# Patient Record
Sex: Male | Born: 1950 | Hispanic: Yes | Marital: Married | Smoking: Former smoker
Health system: Southern US, Community
[De-identification: ages and names within clinical notes are randomized; demographics above are authoritative.]

## PROBLEM LIST (undated history)

## (undated) DIAGNOSIS — F1010 Alcohol abuse, uncomplicated: Secondary | ICD-10-CM

## (undated) DIAGNOSIS — K7030 Alcoholic cirrhosis of liver without ascites: Secondary | ICD-10-CM

## (undated) DIAGNOSIS — D649 Anemia, unspecified: Secondary | ICD-10-CM

## (undated) DIAGNOSIS — I10 Essential (primary) hypertension: Secondary | ICD-10-CM

## (undated) DIAGNOSIS — I8500 Esophageal varices without bleeding: Secondary | ICD-10-CM

## (undated) HISTORY — PX: PARACENTESIS: SHX844

## (undated) SURGERY — Surgical Case
Anesthesia: *Unknown

---

## 2011-07-14 ENCOUNTER — Ambulatory Visit (HOSPITAL_COMMUNITY)
Admission: RE | Admit: 2011-07-14 | Discharge: 2011-07-14 | Disposition: A | Discharge status: Home / Self Care | Payer: Medicare Other | Source: Ambulatory Visit | Attending: Gastroenterology | Admitting: Gastroenterology

## 2011-07-14 DIAGNOSIS — K7460 Unspecified cirrhosis of liver: Secondary | ICD-10-CM | POA: Insufficient documentation

## 2011-07-14 DIAGNOSIS — K319 Disease of stomach and duodenum, unspecified: Secondary | ICD-10-CM | POA: Insufficient documentation

## 2011-07-14 DIAGNOSIS — I8510 Secondary esophageal varices without bleeding: Secondary | ICD-10-CM | POA: Insufficient documentation

## 2011-07-14 DIAGNOSIS — K766 Portal hypertension: Secondary | ICD-10-CM | POA: Insufficient documentation

## 2011-07-14 DIAGNOSIS — Z79899 Other long term (current) drug therapy: Secondary | ICD-10-CM | POA: Insufficient documentation

## 2011-07-14 LAB — BASIC METABOLIC PANEL
CO2: 28 mEq/L (ref 19–32)
Chloride: 101 mEq/L (ref 96–112)
Glucose, Bld: 81 mg/dL (ref 70–99)
Sodium: 138 mEq/L (ref 135–145)

## 2011-08-16 ENCOUNTER — Ambulatory Visit (INDEPENDENT_AMBULATORY_CARE_PROVIDER_SITE_OTHER): Payer: Medicare Other | Admitting: Gastroenterology

## 2011-08-16 DIAGNOSIS — R945 Abnormal results of liver function studies: Secondary | ICD-10-CM

## 2011-08-16 DIAGNOSIS — K7460 Unspecified cirrhosis of liver: Secondary | ICD-10-CM

## 2011-08-16 DIAGNOSIS — K766 Portal hypertension: Secondary | ICD-10-CM

## 2016-04-02 DIAGNOSIS — K652 Spontaneous bacterial peritonitis: Secondary | ICD-10-CM

## 2016-04-02 DIAGNOSIS — R188 Other ascites: Secondary | ICD-10-CM | POA: Diagnosis not present

## 2016-04-02 DIAGNOSIS — E871 Hypo-osmolality and hyponatremia: Secondary | ICD-10-CM

## 2016-04-02 DIAGNOSIS — I81 Portal vein thrombosis: Secondary | ICD-10-CM | POA: Diagnosis not present

## 2016-04-03 DIAGNOSIS — K7030 Alcoholic cirrhosis of liver without ascites: Secondary | ICD-10-CM

## 2016-04-03 DIAGNOSIS — I10 Essential (primary) hypertension: Secondary | ICD-10-CM

## 2016-04-03 DIAGNOSIS — I81 Portal vein thrombosis: Secondary | ICD-10-CM

## 2016-04-03 DIAGNOSIS — D638 Anemia in other chronic diseases classified elsewhere: Secondary | ICD-10-CM

## 2016-04-03 DIAGNOSIS — K767 Hepatorenal syndrome: Secondary | ICD-10-CM

## 2016-04-03 DIAGNOSIS — E871 Hypo-osmolality and hyponatremia: Secondary | ICD-10-CM

## 2016-04-03 DIAGNOSIS — K652 Spontaneous bacterial peritonitis: Secondary | ICD-10-CM

## 2016-05-24 ENCOUNTER — Other Ambulatory Visit: Payer: Self-pay | Admitting: Unknown Physician Specialty

## 2016-05-24 DIAGNOSIS — R188 Other ascites: Secondary | ICD-10-CM

## 2016-05-27 ENCOUNTER — Ambulatory Visit
Admission: RE | Admit: 2016-05-27 | Discharge: 2016-05-27 | Disposition: A | Discharge status: Home / Self Care | Payer: Medicare Other | Source: Ambulatory Visit | Attending: Unknown Physician Specialty | Admitting: Unknown Physician Specialty

## 2016-05-27 DIAGNOSIS — R188 Other ascites: Secondary | ICD-10-CM

## 2016-05-27 HISTORY — PX: IR GENERIC HISTORICAL: IMG1180011

## 2016-05-27 NOTE — H&P (Signed)
Chief Complaint: Patient was seen in consultation today for  Chief Complaint  Patient presents with  . Advice Only    Consult for Denver Shunt     at the request of Butler,Robert  Referring Physician(s): Butler,Robert  Supervising Physician: Gilmer Mor  History of Present Illness: Jaime Russell is a 65 y.o. male with cirrhosis from history of alcohol abuse.   He's been re-referred to [place] for consideration for [place] Shunt to manage ascites which has been worseing over the last 2-3 months.   He has large volume ascites and requires paracentesis about every other week.   He takes furosemide and spironolactone. He also takes lactulose to help minimize encephalopathy.  He is following a low sodium diet.   According to his chart, he has been off alcohol for 15 years.   He lives with his daughter and son-in-law.  He is here with several family members including his niece who speaks [place] well.   His calculated MELD score is 20 and is Child Pugh class B  He also has a known portal vein thrombus and therefore is NOT a candidate for TIPS.   No past medical history on file.  No past surgical history on file.  Allergies: Review of patient's allergies indicates no known allergies.  Medications: Prior to Admission medications   Medication Sig Start Date End Date Taking? Authorizing Provider  esomeprazole (NEXIUM) 40 MG capsule Take 40 mg by mouth daily at 12 noon.   Yes Historical Provider, MD  Ferrous Sulfate Dried 200 (65 Fe) MG TABS Take by mouth.   Yes Historical Provider, MD  furosemide (LASIX) 40 MG tablet Take 40 mg by mouth.   Yes Historical Provider, MD  lactulose (CHRONULAC) 10 GM/15ML solution Take by mouth 2 (two) times daily.   Yes Historical Provider, MD  propranolol (INDERAL) 10 MG tablet Take 10 mg by mouth 3 (three) times daily.   Yes Historical Provider, MD  rifaximin (XIFAXAN) 550 MG TABS tablet Take 550 mg by mouth 2 (two) times daily.   Yes  Historical Provider, MD     No family history on file.  Social History   Social History  . Marital status: Married    Spouse name: N/A  . Number of children: N/A  . Years of education: N/A   Social History Main Topics  . Smoking status: Not on file  . Smokeless tobacco: Not on file  . Alcohol use Not on file  . Drug use: Unknown  . Sexual activity: Not on file   Other Topics Concern  . Not on file   Social History Narrative  . No narrative on file    Review of Systems  Vital Signs: BP 115/61 (BP Location: Left Arm, Patient Position: Sitting, Cuff Size: Normal)   Pulse 72   Temp 98 F (36.7 C) (Oral)   Physical Exam  Constitutional: He is oriented to person, place, and time.  Thin, ill appearing, NAD  HENT:  Head: Normocephalic and atraumatic.  Eyes: EOM are normal. Scleral icterus is present.  Neck: Normal range of motion.  Abdominal: He exhibits distension.  Musculoskeletal:  In a wheelchair  Neurological: He is alert and oriented to person, place, and time. No cranial nerve deficit.  Skin:  Mild jaundice  Psychiatric: He has a normal mood and affect. His behavior is normal. Judgment and thought content normal.  Vitals reviewed.     Imaging: No results found.  Labs:  CBC: No results for input(s): WBC,  HGB, HCT, PLT in the last 8760 hours.  COAGS: No results for input(s): INR, APTT in the last 8760 hours.  BMP: No results for input(s): NA, K, CL, CO2, GLUCOSE, BUN, CALCIUM, CREATININE, GFRNONAA, GFRAA in the last 8760 hours.  Invalid input(s): CMP  LIVER FUNCTION TESTS: No results for input(s): BILITOT, AST, ALT, ALKPHOS, PROT, ALBUMIN in the last 8760 hours.  TUMOR MARKERS: No results for input(s): AFPTM, CEA, CA199, CHROMGRNA in the last 8760 hours.  Assessment:  Large volume ascites secondary to cirrhosis due to history of alcohol abuse which is requiring paracentesis every other week.  His Meld score is 20 and his Child Pugh class is  B.  Not a TIPS candidate secondary to portal vein thrombus.  Appears to be a good candidate for Denver Shunt  Will obtain cytology during next paracentesis to rule out infection.  Will obtain ECHO to evaluate cardiac function.  If ECHO is normal and peritoneal fluid shows no infection, will proceed with placement of [place] Shunt.  Dr. Loreta Ave did explain to the patient and family the risks and benefits of the [facility] shunt including bleeding, infection, heart failure, sepsis, and death. He also explained how and why they will need to pump the shunt twice per day.  All of the patient's questions were answered to the best of our ability.  The patient is agreeable to proceed in the near future.  Electronically Signed: Gwynneth Macleod PA-C 05/27/2016, 1:45 PM   Please refer to Dr. Kenna Gilbert attestation of this note for management and plan.

## 2016-05-28 ENCOUNTER — Telehealth: Payer: Self-pay | Admitting: *Deleted

## 2016-05-28 ENCOUNTER — Encounter (HOSPITAL_COMMUNITY): Payer: Self-pay | Admitting: Emergency Medicine

## 2016-05-28 ENCOUNTER — Inpatient Hospital Stay (HOSPITAL_COMMUNITY)
Admission: EM | Admit: 2016-05-28 | Discharge: 2016-06-01 | DRG: 441 | Disposition: A | Discharge status: Home Health | Payer: Medicare Other | Attending: Student in an Organized Health Care Education/Training Program | Admitting: Student in an Organized Health Care Education/Training Program

## 2016-05-28 ENCOUNTER — Emergency Department (HOSPITAL_COMMUNITY): Payer: Medicare Other

## 2016-05-28 DIAGNOSIS — K7031 Alcoholic cirrhosis of liver with ascites: Secondary | ICD-10-CM | POA: Diagnosis present

## 2016-05-28 DIAGNOSIS — D500 Iron deficiency anemia secondary to blood loss (chronic): Secondary | ICD-10-CM | POA: Diagnosis present

## 2016-05-28 DIAGNOSIS — I8500 Esophageal varices without bleeding: Secondary | ICD-10-CM

## 2016-05-28 DIAGNOSIS — K7290 Hepatic failure, unspecified without coma: Principal | ICD-10-CM

## 2016-05-28 DIAGNOSIS — K31819 Angiodysplasia of stomach and duodenum without bleeding: Secondary | ICD-10-CM | POA: Diagnosis present

## 2016-05-28 DIAGNOSIS — I81 Portal vein thrombosis: Secondary | ICD-10-CM | POA: Diagnosis present

## 2016-05-28 DIAGNOSIS — K7040 Alcoholic hepatic failure without coma: Secondary | ICD-10-CM | POA: Diagnosis present

## 2016-05-28 DIAGNOSIS — G9340 Encephalopathy, unspecified: Secondary | ICD-10-CM

## 2016-05-28 DIAGNOSIS — I8511 Secondary esophageal varices with bleeding: Secondary | ICD-10-CM | POA: Diagnosis present

## 2016-05-28 DIAGNOSIS — D735 Infarction of spleen: Secondary | ICD-10-CM | POA: Diagnosis present

## 2016-05-28 DIAGNOSIS — E1122 Type 2 diabetes mellitus with diabetic chronic kidney disease: Secondary | ICD-10-CM | POA: Diagnosis present

## 2016-05-28 DIAGNOSIS — R188 Other ascites: Secondary | ICD-10-CM

## 2016-05-28 DIAGNOSIS — I8510 Secondary esophageal varices without bleeding: Secondary | ICD-10-CM | POA: Diagnosis not present

## 2016-05-28 DIAGNOSIS — K317 Polyp of stomach and duodenum: Secondary | ICD-10-CM | POA: Diagnosis present

## 2016-05-28 DIAGNOSIS — E871 Hypo-osmolality and hyponatremia: Secondary | ICD-10-CM | POA: Diagnosis present

## 2016-05-28 DIAGNOSIS — N189 Chronic kidney disease, unspecified: Secondary | ICD-10-CM | POA: Diagnosis present

## 2016-05-28 DIAGNOSIS — K7682 Hepatic encephalopathy: Secondary | ICD-10-CM | POA: Diagnosis present

## 2016-05-28 DIAGNOSIS — N62 Hypertrophy of breast: Secondary | ICD-10-CM | POA: Diagnosis present

## 2016-05-28 DIAGNOSIS — K766 Portal hypertension: Secondary | ICD-10-CM | POA: Diagnosis present

## 2016-05-28 DIAGNOSIS — Z79899 Other long term (current) drug therapy: Secondary | ICD-10-CM | POA: Diagnosis not present

## 2016-05-28 DIAGNOSIS — Z01810 Encounter for preprocedural cardiovascular examination: Secondary | ICD-10-CM | POA: Diagnosis not present

## 2016-05-28 DIAGNOSIS — N179 Acute kidney failure, unspecified: Secondary | ICD-10-CM | POA: Diagnosis present

## 2016-05-28 DIAGNOSIS — K5660 Unspecified intestinal obstruction: Secondary | ICD-10-CM | POA: Diagnosis present

## 2016-05-28 DIAGNOSIS — K767 Hepatorenal syndrome: Secondary | ICD-10-CM | POA: Diagnosis present

## 2016-05-28 DIAGNOSIS — Z9889 Other specified postprocedural states: Secondary | ICD-10-CM | POA: Diagnosis not present

## 2016-05-28 DIAGNOSIS — K429 Umbilical hernia without obstruction or gangrene: Secondary | ICD-10-CM | POA: Diagnosis present

## 2016-05-28 DIAGNOSIS — R161 Splenomegaly, not elsewhere classified: Secondary | ICD-10-CM | POA: Diagnosis present

## 2016-05-28 HISTORY — DX: Esophageal varices without bleeding: I85.00

## 2016-05-28 HISTORY — DX: Alcoholic cirrhosis of liver without ascites: K70.30

## 2016-05-28 HISTORY — DX: Alcohol abuse, uncomplicated: F10.10

## 2016-05-28 LAB — I-STAT ARTERIAL BLOOD GAS, ED
Acid-base deficit: 4 mmol/L — ABNORMAL HIGH (ref 0.0–2.0)
Bicarbonate: 18.4 meq/L — ABNORMAL LOW (ref 20.0–24.0)
O2 Saturation: 99 %
Patient temperature: 96.8
TCO2: 19 mmol/L (ref 0–100)
pCO2 arterial: 24.2 mmHg — ABNORMAL LOW (ref 35.0–45.0)
pH, Arterial: 7.487 — ABNORMAL HIGH (ref 7.350–7.450)
pO2, Arterial: 101 mmHg — ABNORMAL HIGH (ref 80.0–100.0)

## 2016-05-28 LAB — CBC WITH DIFFERENTIAL/PLATELET
BASOS ABS: 0.1 10*3/uL (ref 0.0–0.1)
Basophils Relative: 1 %
Eosinophils Absolute: 0.1 10*3/uL (ref 0.0–0.7)
Eosinophils Relative: 1 %
HEMATOCRIT: 34.7 % — AB (ref 39.0–52.0)
HEMOGLOBIN: 11.4 g/dL — AB (ref 13.0–17.0)
LYMPHS ABS: 0.9 10*3/uL (ref 0.7–4.0)
LYMPHS PCT: 11 %
MCH: 26 pg (ref 26.0–34.0)
MCHC: 32.9 g/dL (ref 30.0–36.0)
MCV: 79.2 fL (ref 78.0–100.0)
Monocytes Absolute: 0.9 10*3/uL (ref 0.1–1.0)
Monocytes Relative: 11 %
NEUTROS ABS: 6.3 10*3/uL (ref 1.7–7.7)
NEUTROS PCT: 76 %
PLATELETS: 319 10*3/uL (ref 150–400)
RBC: 4.38 MIL/uL (ref 4.22–5.81)
RDW: 16.5 % — ABNORMAL HIGH (ref 11.5–15.5)
WBC: 8.3 10*3/uL (ref 4.0–10.5)

## 2016-05-28 LAB — ETHANOL

## 2016-05-28 LAB — COMPREHENSIVE METABOLIC PANEL
ALBUMIN: 2.8 g/dL — AB (ref 3.5–5.0)
ALT: 35 U/L (ref 17–63)
AST: 54 U/L — AB (ref 15–41)
Alkaline Phosphatase: 369 U/L — ABNORMAL HIGH (ref 38–126)
Anion gap: 12 (ref 5–15)
BILIRUBIN TOTAL: 1.4 mg/dL — AB (ref 0.3–1.2)
BUN: 43 mg/dL — AB (ref 6–20)
CO2: 17 mmol/L — ABNORMAL LOW (ref 22–32)
CREATININE: 1.42 mg/dL — AB (ref 0.61–1.24)
Calcium: 8.8 mg/dL — ABNORMAL LOW (ref 8.9–10.3)
Chloride: 103 mmol/L (ref 101–111)
GFR calc Af Amer: 59 mL/min — ABNORMAL LOW (ref >60)
GFR, EST NON AFRICAN AMERICAN: 51 mL/min — AB (ref >60)
GLUCOSE: 136 mg/dL — AB (ref 65–99)
POTASSIUM: 4.4 mmol/L (ref 3.5–5.1)
Sodium: 132 mmol/L — ABNORMAL LOW (ref 135–145)
TOTAL PROTEIN: 7.5 g/dL (ref 6.5–8.1)

## 2016-05-28 LAB — RAPID URINE DRUG SCREEN, HOSP PERFORMED
AMPHETAMINES: NOT DETECTED
BARBITURATES: NOT DETECTED
BENZODIAZEPINES: NOT DETECTED
Cocaine: NOT DETECTED
Opiates: NOT DETECTED
Tetrahydrocannabinol: NOT DETECTED

## 2016-05-28 LAB — TYPE AND SCREEN
ABO/RH(D): O POS
ANTIBODY SCREEN: NEGATIVE

## 2016-05-28 LAB — PROTIME-INR
INR: 1.22
PROTHROMBIN TIME: 15.5 s — AB (ref 11.4–15.2)

## 2016-05-28 LAB — URINALYSIS, ROUTINE W REFLEX MICROSCOPIC
Bilirubin Urine: NEGATIVE
GLUCOSE, UA: NEGATIVE mg/dL
HGB URINE DIPSTICK: NEGATIVE
Ketones, ur: NEGATIVE mg/dL
LEUKOCYTES UA: NEGATIVE
Nitrite: NEGATIVE
PH: 6 (ref 5.0–8.0)
Protein, ur: NEGATIVE mg/dL
Specific Gravity, Urine: 1.017 (ref 1.005–1.030)

## 2016-05-28 LAB — CBC
HCT: 32.3 % — ABNORMAL LOW (ref 39.0–52.0)
HEMATOCRIT: 32.3 % — AB (ref 39.0–52.0)
Hemoglobin: 10.6 g/dL — ABNORMAL LOW (ref 13.0–17.0)
Hemoglobin: 10.5 g/dL — ABNORMAL LOW (ref 13.0–17.0)
MCH: 26.1 pg (ref 26.0–34.0)
MCH: 25.9 pg — ABNORMAL LOW (ref 26.0–34.0)
MCHC: 32.8 g/dL (ref 30.0–36.0)
MCHC: 32.5 g/dL (ref 30.0–36.0)
MCV: 79.6 fL (ref 78.0–100.0)
MCV: 79.8 fL (ref 78.0–100.0)
PLATELETS: 291 10*3/uL (ref 150–400)
PLATELETS: 252 10*3/uL (ref 150–400)
RBC: 4.06 MIL/uL — AB (ref 4.22–5.81)
RBC: 4.05 MIL/uL — ABNORMAL LOW (ref 4.22–5.81)
RDW: 16.5 % — AB (ref 11.5–15.5)
RDW: 16.6 % — AB (ref 11.5–15.5)
WBC: 8.6 10*3/uL (ref 4.0–10.5)
WBC: 11.7 10*3/uL — AB (ref 4.0–10.5)

## 2016-05-28 LAB — ABO/RH: ABO/RH(D): O POS

## 2016-05-28 LAB — POC OCCULT BLOOD, ED: Fecal Occult Bld: NEGATIVE

## 2016-05-28 LAB — CBG MONITORING, ED: Glucose-Capillary: 103 mg/dL — ABNORMAL HIGH (ref 65–99)

## 2016-05-28 LAB — I-STAT CG4 LACTIC ACID, ED: Lactic Acid, Venous: 3.35 mmol/L (ref 0.5–1.9)

## 2016-05-28 LAB — LACTIC ACID, PLASMA: LACTIC ACID, VENOUS: 2.2 mmol/L — AB (ref 0.5–1.9)

## 2016-05-28 LAB — AMMONIA: Ammonia: 210 umol/L — ABNORMAL HIGH (ref 9–35)

## 2016-05-28 MED ORDER — SODIUM CHLORIDE 0.9 % IV BOLUS (SEPSIS)
500.0000 mL | Freq: Once | INTRAVENOUS | Status: AC
  Administered 2016-05-28: 500 mL via INTRAVENOUS

## 2016-05-28 MED ORDER — LACTULOSE ENEMA
300.0000 mL | Freq: Two times a day (BID) | ORAL | Status: DC
  Administered 2016-05-28: 300 mL via RECTAL
  Filled 2016-05-28 (×4): qty 300

## 2016-05-28 MED ORDER — ACETAMINOPHEN 650 MG RE SUPP: 650.0000 mg | Freq: Four times a day (QID) | RECTAL | Status: DC | PRN

## 2016-05-28 MED ORDER — ACETAMINOPHEN 325 MG PO TABS
650.0000 mg | ORAL_TABLET | Freq: Four times a day (QID) | ORAL | Status: DC | PRN
  Administered 2016-05-31: 650 mg via ORAL
  Filled 2016-05-28: qty 2

## 2016-05-28 MED ORDER — OCTREOTIDE LOAD VIA INFUSION
50.0000 ug | Freq: Once | INTRAVENOUS | Status: AC
  Administered 2016-05-28: 50 ug via INTRAVENOUS
  Filled 2016-05-28: qty 25

## 2016-05-28 MED ORDER — PANTOPRAZOLE SODIUM 40 MG IV SOLR
40.0000 mg | Freq: Two times a day (BID) | INTRAVENOUS | Status: DC
  Administered 2016-05-28 – 2016-05-30 (×5): 40 mg via INTRAVENOUS
  Filled 2016-05-28 (×6): qty 40

## 2016-05-28 MED ORDER — PANTOPRAZOLE SODIUM 40 MG IV SOLR
40.0000 mg | Freq: Once | INTRAVENOUS | Status: AC
  Administered 2016-05-28: 40 mg via INTRAVENOUS
  Filled 2016-05-28: qty 40

## 2016-05-28 MED ORDER — DEXTROSE 5 % IV SOLN
1.0000 g | INTRAVENOUS | Status: DC
  Administered 2016-05-28 – 2016-05-31 (×4): 1 g via INTRAVENOUS
  Filled 2016-05-28 (×5): qty 10

## 2016-05-28 MED ORDER — ONDANSETRON HCL 4 MG/2ML IJ SOLN
4.0000 mg | Freq: Once | INTRAMUSCULAR | Status: AC
  Administered 2016-05-28: 4 mg via INTRAVENOUS
  Filled 2016-05-28: qty 2

## 2016-05-28 MED ORDER — SODIUM CHLORIDE 0.9 % IV SOLN
50.0000 ug/h | INTRAVENOUS | Status: DC
  Administered 2016-05-28 – 2016-05-30 (×5): 50 ug/h via INTRAVENOUS
  Filled 2016-05-28 (×9): qty 1

## 2016-05-28 MED ORDER — LACTULOSE ENEMA
300.0000 mL | Freq: Once | ORAL | Status: AC
  Administered 2016-05-28: 300 mL via RECTAL
  Filled 2016-05-28: qty 300

## 2016-05-28 NOTE — Telephone Encounter (Signed)
Patient Jaime Russell's niece Merdis Delay called and states pt is confused, throwing up blood and shaking. She has spoken with his physician at [facility], Dr Charm Barges and he has advised them to go to [facility] ED. I have sent a msg to Dr Loreta Ave to make him aware.

## 2016-05-28 NOTE — ED Notes (Signed)
Attempted report 

## 2016-05-28 NOTE — ED Notes (Signed)
Pt rectal temp. 96.8 Nurse was notified.

## 2016-05-28 NOTE — ED Notes (Signed)
EDP aware of Temp. Warm blankets given.

## 2016-05-28 NOTE — ED Notes (Signed)
Jewelry given to family (2 necklaces)

## 2016-05-28 NOTE — ED Triage Notes (Signed)
Patient diaphoric on arrival. Patient confused. Assessment done with stratus. Patient unable to answer any questions with interpreter.

## 2016-05-28 NOTE — ED Triage Notes (Signed)
Patient comes from home with complaints of Alteral Mental Status. Per EMS family found patient on the floor confused and has become increasingly altered. EMS states patient responds to verbal stimuli.

## 2016-05-28 NOTE — H&P (Signed)
Date: 05/28/2016               Patient Name:  Jaime Russell MRN: [id number]  DOB: 01/06/1951 Age / Sex: 65 y.o., male   PCP: Maryella Shivers, MD         Medical Service: Internal Medicine Teaching Service         Attending Physician: Dr. Axel Filler, MD    First Contact: Dr. Holley Raring Pager: [phone]  Second Contact: Dr. Charlott Rakes Pager: [phone]       After Hours (After 5p/  First Contact Pager: [phone]  weekends / holidays): Second Contact Pager: [phone]   Chief Complaint: hepatic encephalopathy  History of Present Illness: [name] is a 65 y.o. 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 Pt is minimally responsive and family is not present at the time of the exam. History is gathered from chart review.  He was reportedly found down by family this morning with AMS. He had reportedly been having N/V for 1 week prior. Yesterday, he was evaluated by Ir for a [place] shunt to relieve his refractory ascites. At that time, records reflects the pt had normal MS.  Since presentation to the ED, pt has been lethargic but rousable and has had several episodes of emesis w/ small flecks of blood. FOBT was negative and lactulose enema was started. GI was consulted and have seen the patient. He was found to be hypothermic, but otherwise HDS w/o fever, tachycardia, WBC. Head CT was ordered to r/o ICH in the setting of unwitnessed fall vs collapse.  Meds: Current Facility-Administered Medications  Medication Dose Route Frequency Provider Last Rate Last Dose  . acetaminophen (TYLENOL) tablet 650 mg  650 mg Oral Q6H PRN Norman Herrlich, MD       Or  . acetaminophen (TYLENOL) suppository 650 mg  650 mg Rectal Q6H PRN Norman Herrlich, MD      . cefTRIAXone (ROCEPHIN) 1 g in dextrose 5 % 50 mL IVPB  1 g Intravenous Q24H Vena Rua, PA-C 100 mL/hr at 05/28/16 1606 1 g at 05/28/16 1606  . lactulose  (CHRONULAC) enema 200 gm  300 mL Rectal BID Norman Herrlich, MD      . octreotide (SANDOSTATIN) 500 mcg in sodium chloride 0.9 % 250 mL (2 mcg/mL) infusion  50 mcg/hr Intravenous Continuous Sherwood Gambler, MD 25 mL/hr at 05/28/16 1327 50 mcg/hr at 05/28/16 1327  . pantoprazole (PROTONIX) injection 40 mg  40 mg Intravenous Q12H Vena Rua, PA-C   40 mg at 05/28/16 1601   Current Outpatient Prescriptions  Medication Sig Dispense Refill  . esomeprazole (NEXIUM) 40 MG capsule Take 40 mg by mouth daily at 12 noon.    . ferrous sulfate 325 (65 FE) MG tablet Take 325 mg by mouth daily with breakfast.    . furosemide (LASIX) 40 MG tablet Take 40 mg by mouth 2 (two) times daily.     Marland Kitchen lactulose (CHRONULAC) 10 GM/15ML solution Take 30 g by mouth 2 (two) times daily.     . propranolol (INDERAL) 10 MG tablet Take 10 mg by mouth 2 (two) times daily.     . rifaximin (XIFAXAN) 550 MG TABS tablet Take 550 mg by mouth 2 (two) times daily.     Allergies: Allergies as of 05/28/2016  . (No Known Allergies)   Past Medical History:  Diagnosis Date  . Alcohol  abuse   . Alcoholic cirrhosis ([place])   . Esophageal varices (HCC)    Family History: Unable to be obtained d/t pt's AMS  Social History: Unable to be obtained d/t pt's AMS  Review of Systems: A complete ROS was negative except as per HPI. Review of Systems  Unable to perform ROS: Mental status change    Physical Exam: Vitals:   05/28/16 1530 05/28/16 1545 05/28/16 1600 05/28/16 1608  BP: 146/93 133/66 144/83   Pulse: 100 96 101   Resp: _0 Temp:    98.2 F (36.8 C)  TempSrc:    Oral  SpO2: 100% 100% 100%    Physical Exam  Constitutional: He appears lethargic.  Chronically ill-appearing  HENT:  Head: Normocephalic and atraumatic.  Eyes: Pupils are equal, round, and reactive to light.  Cardiovascular: Normal rate, regular rhythm, normal heart sounds and intact distal pulses.  Exam reveals no gallop and no friction rub.   No  murmur heard. Pulmonary/Chest: Effort normal and breath sounds normal. No respiratory distress. He has no wheezes. He has no rales.  Abdominal: He exhibits distension, fluid wave and ascites. Hepatosplenomegaly: could not appreciate d/t distension.  Neurological: He appears lethargic. He is disoriented (oriented only to self).  Skin: Skin is warm and dry. He is not diaphoretic.  Many telangiectasias over trunk and neck   Labs: CBC:  Recent Labs Lab 05/28/16 1218  WBC 8.3  NEUTROABS 6.3  HGB 11.4*  HCT 34.7*  MCV 79.2  PLT 786    Basic Metabolic Panel:  Recent Labs Lab 05/28/16 1218  NA 132*  K 4.4  CL 103  CO2 17*  GLUCOSE 136*  BUN 43*  CREATININE 1.42*  CALCIUM 8.8*   Coagulation Studies:  Recent Labs  05/28/16 1218  LABPROT 15.5*  INR 1.22   Liver Function Tests:  Recent Labs Lab 05/28/16 1218  AST 54*  ALT 35  ALKPHOS 369*  BILITOT 1.4*  PROT 7.5  ALBUMIN 2.8*   Recent Labs Lab 05/28/16 1218  AMMONIA 210*    Recent Labs Lab 05/28/16 1358  GLUCAP 103*   Urine Studies: Urinalysis    Component Value Date/Time   COLORURINE YELLOW 05/28/2016 1410   APPEARANCEUR CLEAR 05/28/2016 1410   LABSPEC 1.017 05/28/2016 1410   PHURINE 6.0 05/28/2016 1410   GLUCOSEU NEGATIVE 05/28/2016 1410   HGBUR NEGATIVE 05/28/2016 1410   BILIRUBINUR NEGATIVE 05/28/2016 1410   KETONESUR NEGATIVE 05/28/2016 1410   PROTEINUR NEGATIVE 05/28/2016 1410   NITRITE NEGATIVE 05/28/2016 1410   LEUKOCYTESUR NEGATIVE 05/28/2016 1410   Drugs of Abuse     Component Value Date/Time   LABOPIA NONE DETECTED 05/28/2016 1410   COCAINSCRNUR NONE DETECTED 05/28/2016 1410   LABBENZ NONE DETECTED 05/28/2016 1410   AMPHETMU NONE DETECTED 05/28/2016 1410   THCU NONE DETECTED 05/28/2016 1410   LABBARB NONE DETECTED 05/28/2016 1410    EKG: EKG: there are no previous tracings available for comparison, normal sinus rhythm.  Imaging: Ct Head Wo Contrast  Result Date:  05/28/2016 CLINICAL DATA:  Altered mental status EXAM: CT HEAD WITHOUT CONTRAST TECHNIQUE: Contiguous axial images were obtained from the base of the skull through the vertex without intravenous contrast. COMPARISON:  None. FINDINGS: There are patchy areas of low attenuation throughout the subcortical and periventricular white matter compatible with chronic microvascular disease. Prominence of the sulci and ventricles identified compatible with brain atrophy. There are a few scattered parenchymal calcifications identified which may be related to old infection.  The paranasal sinuses and mastoid air cells are clear. The calvarium is intact. IMPRESSION: 1. No acute intracranial abnormalities. 2. Chronic microvascular disease and brain atrophy. 3. Scattered parenchymal calcifications identified which likely reflect sequelae of old infection. Electronically Signed   By: Kerby Moors M.D.   On: 05/28/2016 13:29   Dg Chest Port 1 View  Result Date: 05/28/2016 CLINICAL DATA:  Altered mental status EXAM: PORTABLE CHEST 1 VIEW COMPARISON:  None FINDINGS: The heart size appears normal. The lung volumes are low and there is atelectasis within the lung bases. No airspace consolidation. IMPRESSION: 1. Low lung volumes and bibasilar atelectasis. Electronically Signed   By: Kerby Moors M.D.   On: 05/28/2016 12:38   Assessment & Plan by Problem: Principal Problem:   Hepatic encephalopathy ([place]) Active Problems:   Esophageal varices (HCC)   Alcoholic cirrhosis ([place])  [name] is a 65 y.o. male who is HDS being admitted with alcoholic cirrhosis and encephalopathy.  1) Hepatic encephalopathy: Found down by family after worsening N/V. AMS w/ elevated ammonia. Negative head CT, negative UDS and EtOH. Reportedly compliant with rifaxan and lactulose at home. He is mildly hypothermic w/o leukocytosis and no obvious signs of infectious source. 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 Pt is lethargic but rousable. Concern for airway protection in the setting of several episodes of emesis, but currently protecting w/o ETT. - admit to Stepdown - GI c/s, appreciate recs - lactulose enema BID - follow CMET, ammonia in AM  2) Hematemasis / Esophageal Varices: Pt with known esophageal varices on EGD in 02/2016. Reported two episodes of hematemesis with small specks of blood this AM. Currently HDS, Hg 11.4, normotensive, no tachycardia. 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 Pt has refractory ascites requiring LV paracentesis q2wks. Being evaluated for [place] shunt as outpt, needs echocardiogram preop. Currently belly is tense, tympanic and distended. Would likely benefit from therapeutic paracentesis. Concern for SBO w/ hypothermia, though negative leukocytosis and afebrile. LA was elevated at 3.35. - IR paracentesis  - therapeutic drainage  - fluid cx, cell count, gram stain, alk phos - Rocephin for empiric SBO - trend LA in AM - possible TTE tomorrow for [place] shunt preop  DVT PPx - SCD's while in bed  Code Status - Full  Consults Placed - GI  Dispo: Admit patient to Inpatient with expected length of stay greater than 2 midnights.  Signed: Holley Raring, MD 05/28/2016, 4:57 PM  Pager: [phone]

## 2016-05-28 NOTE — ED Provider Notes (Signed)
[facility]ER[facility]ENCY DEPT Provider Note   CSN: [id number] Arrival date & time: 05/28/16  1152  First Provider Contact:  First MD Initiated Contact with Patient 05/28/16 1155      LEVEL 5 CAVEAT - ALTERED MENTAL STATUS  History   Chief Complaint Chief Complaint  Patient presents with  . Altered Mental Status    HPI Jaime Russell is a 65 y.o. male presenting with altered mental status. History is very limited as the patient does not answer questions. Even when using the interpreter, patient just does not respond. Family arrived shortly after arrival and state that patient has been feeling bad feeling weak and having nausea and vomiting for about one week. Has cirrhosis and concomitant ascites. Last had his abdomen drained one week ago. Is being evaluated for a procedure to help relieve the recurrent swelling. He has not been complaining of abdominal pain. He has not had fevers. However this morning daily found him lying on the floor next to his bed. He is currently altered and this seems like when he has had prior ammonia issues. Patient vomited 2 x today, once with EMS and once at home. Both times had some dark blood in it. History of varices/banding per family. All of his previous GI workup/treatment has been in [place].  HPI  No past medical history on file.  There are no active problems to display for this patient.   No past surgical history on file.     Home Medications    Prior to Admission medications   Medication Sig Start Date End Date Taking? Authorizing Provider  esomeprazole (NEXIUM) 40 MG capsule Take 40 mg by mouth daily at 12 noon.    Historical Provider, MD  Ferrous Sulfate Dried 200 (65 Fe) MG TABS Take by mouth.    Historical Provider, MD  furosemide (LASIX) 40 MG tablet Take 40 mg by mouth.    Historical Provider, MD  lactulose (CHRONULAC) 10 GM/15ML solution Take by mouth 2 (two) times daily.    Historical Provider, MD  propranolol (INDERAL) 10 MG tablet Take 10  mg by mouth 3 (three) times daily.    Historical Provider, MD  rifaximin (XIFAXAN) 550 MG TABS tablet Take 550 mg by mouth 2 (two) times daily.    Historical Provider, MD    Family History No family history on file.  Social History Social History  Substance Use Topics  . Smoking status: Not on file  . Smokeless tobacco: Not on file  . Alcohol use Not on file     Allergies   Review of patient's allergies indicates no known allergies.   Review of Systems Review of Systems  Unable to perform ROS: Mental status change     Physical Exam Updated Vital Signs BP 125/77   Pulse 87   Temp (!) 96.8 F (36 C) (Rectal)   Resp 25   SpO2 100%   Physical Exam  Constitutional: He appears well-developed and well-nourished. He appears ill.  HENT:  Head: Normocephalic and atraumatic.  Right Ear: External ear normal.  Left Ear: External ear normal.  Nose: Nose normal.  Eyes: Pupils are equal, round, and reactive to light. Right eye exhibits no discharge. Left eye exhibits no discharge.  Neck: Neck supple.  Cardiovascular: Normal rate, regular rhythm, normal heart sounds and intact distal pulses.   Pulmonary/Chest: Effort normal and breath sounds normal.  Abdominal: Soft. He exhibits distension. There is no tenderness.  Soft but distended abdomen. Does not appear to cause patient pain. Small  wound noted just left and inferior to umbilicus with some mild clear drainage  Musculoskeletal: He exhibits no edema.  Neurological: He is alert. He is disoriented.  Patient is awake but easily falls asleep. Awakens to voice and light palpation. Moves all 4 extremities but does not follow commands.  Skin: Skin is warm and dry.  Nursing note and vitals reviewed.    ED Treatments / Results  Labs (all labs ordered are listed, but only abnormal results are displayed) Labs Reviewed  CBC WITH DIFFERENTIAL/PLATELET - Abnormal; Notable for the following:       Result Value   Hemoglobin 11.4 (*)      HCT 34.7 (*)    RDW 16.5 (*)    All other components within normal limits  AMMONIA - Abnormal; Notable for the following:    Ammonia 210 (*)    All other components within normal limits  COMPREHENSIVE METABOLIC PANEL - Abnormal; Notable for the following:    Sodium 132 (*)    CO2 17 (*)    Glucose, Bld 136 (*)    BUN 43 (*)    Creatinine, Ser 1.42 (*)    Calcium 8.8 (*)    Albumin 2.8 (*)    AST 54 (*)    Alkaline Phosphatase 369 (*)    Total Bilirubin 1.4 (*)    GFR calc non Af Amer 51 (*)    GFR calc Af Amer 59 (*)    All other components within normal limits  PROTIME-INR - Abnormal; Notable for the following:    Prothrombin Time 15.5 (*)    All other components within normal limits  CBG MONITORING, ED - Abnormal; Notable for the following:    Glucose-Capillary 103 (*)    All other components within normal limits  I-STAT ARTERIAL BLOOD GAS, ED - Abnormal; Notable for the following:    pH, Arterial 7.487 (*)    pCO2 arterial 24.2 (*)    pO2, Arterial 101.0 (*)    Bicarbonate 18.4 (*)    Acid-base deficit 4.0 (*)    All other components within normal limits  I-STAT CG4 LACTIC ACID, ED - Abnormal; Notable for the following:    Lactic Acid, Venous 3.35 (*)    All other components within normal limits  URINE CULTURE  ETHANOL  URINE RAPID DRUG SCREEN, HOSP PERFORMED  URINALYSIS, ROUTINE W REFLEX MICROSCOPIC (NOT AT [facility])  POC OCCULT BLOOD, ED  TYPE AND SCREEN  ABO/RH    EKG  EKG Interpretation  Date/Time:  Friday May 28 2016 11:59:57 EDT Ventricular Rate:  89 PR Interval:    QRS Duration: 89 QT Interval:  404 QTC Calculation: 492 R Axis:   18 Text Interpretation:  Sinus rhythm Probable anterolateral infarct, old No old tracing to compare Confirmed by Alyscia Carmon MD, Arpita Fentress [phone]) on 05/28/2016 12:34:04 PM       Radiology Ct Head Wo Contrast  Result Date: 05/28/2016 CLINICAL DATA:  Altered mental status EXAM: CT HEAD WITHOUT CONTRAST TECHNIQUE: Contiguous  axial images were obtained from the base of the skull through the vertex without intravenous contrast. COMPARISON:  None. FINDINGS: There are patchy areas of low attenuation throughout the subcortical and periventricular white matter compatible with chronic microvascular disease. Prominence of the sulci and ventricles identified compatible with brain atrophy. There are a few scattered parenchymal calcifications identified which may be related to old infection. The paranasal sinuses and mastoid air cells are clear. The calvarium is intact. IMPRESSION: 1. No acute intracranial abnormalities. 2. Chronic  microvascular disease and brain atrophy. 3. Scattered parenchymal calcifications identified which likely reflect sequelae of old infection. Electronically Signed   By: Signa Kell M.D.   On: 05/28/2016 13:29   Dg Chest Port 1 View  Result Date: 05/28/2016 CLINICAL DATA:  Altered mental status EXAM: PORTABLE CHEST 1 VIEW COMPARISON:  None FINDINGS: The heart size appears normal. The lung volumes are low and there is atelectasis within the lung bases. No airspace consolidation. IMPRESSION: 1. Low lung volumes and bibasilar atelectasis. Electronically Signed   By: Signa Kell M.D.   On: 05/28/2016 12:38    Procedures Procedures (including critical care time)  Medications Ordered in ED Medications  lactulose (CHRONULAC) enema 200 gm (not administered)  octreotide (SANDOSTATIN) 2 mcg/mL load via infusion 50 mcg (50 mcg Intravenous Bolus from Bag 05/28/16 1328)    And  octreotide (SANDOSTATIN) 500 mcg in sodium chloride 0.9 % 250 mL (2 mcg/mL) infusion (50 mcg/hr Intravenous New Bag/Given 05/28/16 1327)  cefTRIAXone (ROCEPHIN) 1 g in dextrose 5 % 50 mL IVPB (not administered)  pantoprazole (PROTONIX) injection 40 mg (not administered)  pantoprazole (PROTONIX) injection 40 mg (40 mg Intravenous Given 05/28/16 1250)  ondansetron (ZOFRAN) injection 4 mg (4 mg Intravenous Given 05/28/16 1257)  sodium chloride 0.9  % bolus 500 mL (0 mLs Intravenous Stopped 05/28/16 1510)     Initial Impression / Assessment and Plan / ED Course  I have reviewed the triage vital signs and the nursing notes.  Pertinent labs & imaging results that were available during my care of the patient were reviewed by me and considered in my medical decision making (see chart for details).  Clinical Course  Comment By Time  Patient is awake but confused. Lethargic but not needing emergent airway management. D/w family who says this is just like previous hepatic encephalopathy episodes. Pricilla Loveless, MD 08/04 1225  Patient is mildly hypothermic. I still think this is probably hepatic encephalopathy but will add lactic acid. However he is not hypotensive and his white blood cell count is normal. Pricilla Loveless, MD 08/04 1241  Ammonia very high. PR lactulose. Octreotide/protonix for possible GI bleed. After CT head will admit. Consult GI. Pricilla Loveless, MD 08/04 [phone]  Internal medicine teaching service to admit. Patient is currently awake. While he is altered he does not appear to need acute airway intervention Pricilla Loveless, MD 08/04 1456   Patient was noted to be mildly hypothermic. For whatever reason there was a delayed result in his lactic acid which is now 3. He is not hypotensive, tachycardic, and white blood cell as normal. I'm getting no reproducible abdominal pain or obvious signs/symptoms of infection. His presentation seems to be related to hepatic encephalopathy and I doubt sepsis. Not sure why his lactic is elevated unless his poor by mouth intake. We'll give fluids but I do not think to monitor needed. Internal medicine to admit.  Final Clinical Impressions(s) / ED Diagnoses   Final diagnoses:  Ascites  Hepatic encephalopathy [facility])    New Prescriptions New Prescriptions   No medications on file     Pricilla Loveless, MD 05/28/16 1519

## 2016-05-28 NOTE — Consult Note (Signed)
[facility] Consult: 1:21 PM 05/28/2016  LOS: 0 days    Referring Provider: Dr Regenia Skeeter in ED  Primary Care Physician:  Maryella Shivers, MD Primary Gastroenterologist:  In [place]: Dr Melina Copa  .  Dr Monica Martinez at [facility] has seen pt.      Reason for Consultation:  hematemesis   HPI: Jaime Russell is a 65 y.o. male.  Does not speak English and has low health literacy.  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  Marland Kitchen  Ascites requiring frequent paracentesis.  Hepatic encephalopathy.  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  Splenomegaly.  Gynecomastia.  Umbilical hernia.  06/2011 EGD: Dr Paulita Fujita.  Grade 2 distal esophageal varices, no red wale signs, diffuse portal gastropathy. 02/2016 EGD.  Dr Melina Copa.  Esophageal varices, mentioned in notes but no report so unsure if he was banded 02/2016 Colonoscopy for personal hx colon polyps.  Normal study.    Home meds include Nexium, Lactulose, Xifaxan, Iron, propanolol.  Spironolactone discontinued due to hyperkalemia. Lives with his dtr who manages his meds.  Ascites requiring frequent 7 to 8 liter paracentesis, was every 2 weeks but lately every week.  Was for repeat tap this AM with fluid studies.   Seen as outpt 05/27/16 by IR, Dr Corrie Mckusick, for consideration of [place] shunt for management of ascites. "Before proceeding with DS, I believe we should confirm that he has no unknown heart issues such as tricuspid regurgitation or decreased ejection fraction, and also we should send extra labs on his ascites to rule out SBP"  Suggested Echo, studies to r/o SBP and that these could be done closer to home in [place]. " After results, we can potentially move forward with [facility] Shunt"   However he vomited and  blood clots seen in emesis.  He is confused and altered.   Advised by IR staff to go to ED as he was c/o hematemesis and shaking.  Having altered mentation.     Ammonia 210.  Hgb is 11.4, MCV 79.  Platelets 319.  coags ok.  FOBT negative.  BUN/creat 43/1.4.  No priors to compare.       No past medical history on file.  No past surgical history on file.  Prior to Admission medications   Medication Sig Start Date End Date Taking? Authorizing Provider  esomeprazole (NEXIUM) 40 MG capsule Take 40 mg by mouth daily at 12 noon.    Historical Provider, MD  Ferrous Sulfate Dried 200 (65 Fe) MG TABS Take by mouth.    Historical Provider, MD  furosemide (LASIX) 40 MG tablet Take 40 mg by mouth.    Historical Provider, MD  lactulose (CHRONULAC) 10 GM/15ML solution Take by mouth 2 (two) times daily.    Historical Provider, MD  propranolol (INDERAL) 10 MG tablet Take 10 mg by mouth 3 (three) times daily.    Historical Provider, MD  rifaximin (XIFAXAN) 550 MG TABS tablet Take 550 mg by mouth 2 (two) times daily.    Historical Provider,  MD    Scheduled Meds: . lactulose  300 mL Rectal Once  . octreotide  50 mcg Intravenous Once   Infusions: . octreotide  (SANDOSTATIN)    IV infusion    . sodium chloride     PRN Meds:    Allergies as of 05/28/2016  . (No Known Allergies)    No family history on file.  Social History   Social History  . Marital status: Married    Spouse name: N/A  . Number of children: N/A  . Years of education: N/A   Occupational History  . Not on file.   Social History Main Topics  . Smoking status: Not on file  . Smokeless tobacco: Not on file  . Alcohol use Not on file  . Drug use: Unknown  . Sexual activity: Not on file   Other Topics Concern  . Not on file   Social History Narrative  . No narrative on file    REVIEW OF SYSTEMS: Constitutional:  Doing well yesterday, no recent confusion ENT:  No nose bleeds Pulm:  No SOB or cough CV:  No  palpitations, no LE edema.  GU:  No hematuria, no frequency GI:  Per HPI.  Up til today, eating well, no tarry stools.  No c/o abdominal pain Heme:  No unusual bleeding or bruising   Transfusions:  None in records Neuro:  No headaches, no peripheral tingling or numbness Derm:  No itching, no rash or sores.  Endocrine:  No sweats or chills.  No polyuria or dysuria Immunization:  reviewed and hepatits A and B vaccines up to date.  Travel:  None beyond local counties in last few months.    PHYSICAL EXAM: Vital signs in last 24 hours: Vitals:   05/28/16 1207 05/28/16 1237  BP: 127/88   Pulse: 85   Resp: 14   Temp:  (!) 96.8 F (36 C)   Wt Readings from Last 3 Encounters:  No data found for Wt    General: acutely ill, obtunded.  Gravely ill. Head:  No asymmetry or swelling  Eyes:  No icterus or pallor Ears:  Not able to assess hearing as he is not responding to voice  Nose:  No discharge Mouth:  Dental condition poor, moist and clear oral MM, no oral lesions or blood Neck:  No mass or JVD Lungs:  Clear in front.  resp rate is slow and shallow Heart: slightly tachy,  regular Abdomen:  Large distention, not tender.  .   Rectal: deferred GU:  I/O cath prodeuced ~ 200 to 250 cc of bright yellow urine   Musc/Skeltl: no joint redness or swelling.  Extremities:  Non-pitting pedal edema  Neurologic:  Not oriented, not following commands.  No asterixis or tremor Skin:  Sallow.  telangectasias on upper chest   Psych:  Unable to assess.   Intake/Output from previous day: No intake/output data recorded. Intake/Output this shift: No intake/output data recorded.  LAB RESULTS:  Recent Labs  05/28/16 1218  WBC 8.3  HGB 11.4*  HCT 34.7*  PLT 319   BMET Lab Results  Component Value Date   NA 132 (L) 05/28/2016   NA 138 07/14/2011   K 4.4 05/28/2016   K 3.8 07/14/2011   CL 103 05/28/2016   CL 101 07/14/2011   CO2 17 (L) 05/28/2016   CO2 28 07/14/2011   GLUCOSE 136 (H)  05/28/2016   GLUCOSE 81 07/14/2011   BUN 43 (H) 05/28/2016   BUN 8 07/14/2011  CREATININE 1.42 (H) 05/28/2016   CREATININE 0.54 07/14/2011   CALCIUM 8.8 (L) 05/28/2016   CALCIUM 8.7 07/14/2011   LFT  Recent Labs  05/28/16 1218  PROT 7.5  ALBUMIN 2.8*  AST 54*  ALT 35  ALKPHOS 369*  BILITOT 1.4*   PT/INR Lab Results  Component Value Date   INR 1.22 05/28/2016   Hepatitis Panel No results for input(s): HEPBSAG, HCVAB, HEPAIGM, HEPBIGM in the last 72 hours. C-Diff No components found for: CDIFF Lipase  No results found for: LIPASE  Drugs of Abuse  No results found for: LABOPIA, COCAINSCRNUR, LABBENZ, AMPHETMU, THCU, LABBARB   RADIOLOGY STUDIES: Dg Chest Port 1 View  Result Date: 05/28/2016 CLINICAL DATA:  Altered mental status EXAM: PORTABLE CHEST 1 VIEW COMPARISON:  None FINDINGS: The heart size appears normal. The lung volumes are low and there is atelectasis within the lung bases. No airspace consolidation. IMPRESSION: 1. Low lung volumes and bibasilar atelectasis. Electronically Signed   By: Kerby Moors M.D.   On: 05/28/2016 12:38    ENDOSCOPIC STUDIES: Per HPI  IMPRESSION:   *  Decompensated cirrhosis with:   Refractory ascites.  Initial eval for consideration or [place] shunt yesterday 05/27/16.  By exam needs another paracentesis.   Hepatic encephalopathy, acute recurrent despite rifaxan and lactulose.  Hematemesis.  Previous banding of esophageal varices before 2012.  Varices noted on 02/2016 EGD but not clear is banded then.   Portal, splenic, SMA vein thrombosis.  Has disqualified him for transplant. Also leading to refractory ascites.  Hyponatremia.  On the bright side:  No coagulopathy or thrombocytopenia.     PLAN:     *  Needs EGD with MAC.  *  Added Rocephin.  Already started on Octreotide gtt and got 40 mg of Protonix.  Will start BID IV Protonix.   *  If AMS worsens, may need ETT to protect airway.    *  Needs paracentesis with clx, stain,   Total protein, cell count, alk phos as desired by IR.  Can also have echo as requested by IR while inpt.    *  Limited discussion with dtr and niece (later is Optometrist) re code status.  Pt had said in past he wants everything done but while inpt should have family mtg with [place] as he has grim near term prognosis.    Azucena Freed  05/28/2016, 1:21 PM Pager: [phone]

## 2016-05-29 ENCOUNTER — Inpatient Hospital Stay (HOSPITAL_COMMUNITY): Payer: Medicare Other | Admitting: Certified Registered Nurse Anesthetist

## 2016-05-29 ENCOUNTER — Encounter (HOSPITAL_COMMUNITY): Payer: Self-pay | Admitting: *Deleted

## 2016-05-29 ENCOUNTER — Encounter (HOSPITAL_COMMUNITY)
Admission: EM | Disposition: A | Discharge status: Home Health | Payer: Self-pay | Source: Home / Self Care | Attending: Student in an Organized Health Care Education/Training Program

## 2016-05-29 DIAGNOSIS — R188 Other ascites: Secondary | ICD-10-CM

## 2016-05-29 DIAGNOSIS — K7040 Alcoholic hepatic failure without coma: Secondary | ICD-10-CM | POA: Diagnosis not present

## 2016-05-29 HISTORY — PX: ESOPHAGOGASTRODUODENOSCOPY: SHX5428

## 2016-05-29 LAB — COMPREHENSIVE METABOLIC PANEL
ALT: 30 U/L (ref 17–63)
AST: 43 U/L — AB (ref 15–41)
Albumin: 2.4 g/dL — ABNORMAL LOW (ref 3.5–5.0)
Alkaline Phosphatase: 309 U/L — ABNORMAL HIGH (ref 38–126)
Anion gap: 12 (ref 5–15)
BILIRUBIN TOTAL: 2 mg/dL — AB (ref 0.3–1.2)
BUN: 43 mg/dL — AB (ref 6–20)
CALCIUM: 8.9 mg/dL (ref 8.9–10.3)
CO2: 17 mmol/L — ABNORMAL LOW (ref 22–32)
CREATININE: 1.58 mg/dL — AB (ref 0.61–1.24)
Chloride: 107 mmol/L (ref 101–111)
GFR, EST AFRICAN AMERICAN: 52 mL/min — AB (ref >60)
GFR, EST NON AFRICAN AMERICAN: 45 mL/min — AB (ref >60)
Glucose, Bld: 103 mg/dL — ABNORMAL HIGH (ref 65–99)
Potassium: 4 mmol/L (ref 3.5–5.1)
Sodium: 136 mmol/L (ref 135–145)
TOTAL PROTEIN: 6.1 g/dL — AB (ref 6.5–8.1)

## 2016-05-29 LAB — BODY FLUID CELL COUNT WITH DIFFERENTIAL
LYMPHS FL: 12 %
MONOCYTE-MACROPHAGE-SEROUS FLUID: 71 % (ref 50–90)
Neutrophil Count, Fluid: 17 % (ref 0–25)
WBC FLUID: 53 uL (ref 0–1000)

## 2016-05-29 LAB — URINE CULTURE: CULTURE: NO GROWTH

## 2016-05-29 LAB — LACTIC ACID, PLASMA: Lactic Acid, Venous: 2.2 mmol/L (ref 0.5–1.9)

## 2016-05-29 LAB — HEMOGLOBIN: HEMOGLOBIN: 10.3 g/dL — AB (ref 13.0–17.0)

## 2016-05-29 LAB — GRAM STAIN

## 2016-05-29 LAB — ALKALINE PHOSPHATASE: ALK PHOS: 285 U/L — AB (ref 38–126)

## 2016-05-29 LAB — CBC
HEMATOCRIT: 31.8 % — AB (ref 39.0–52.0)
HEMOGLOBIN: 10.1 g/dL — AB (ref 13.0–17.0)
MCH: 26.1 pg (ref 26.0–34.0)
MCHC: 31.8 g/dL (ref 30.0–36.0)
MCV: 82.2 fL (ref 78.0–100.0)
Platelets: 240 10*3/uL (ref 150–400)
RBC: 3.87 MIL/uL — AB (ref 4.22–5.81)
RDW: 16.9 % — ABNORMAL HIGH (ref 11.5–15.5)
WBC: 11.6 10*3/uL — AB (ref 4.0–10.5)

## 2016-05-29 LAB — AMMONIA: Ammonia: 69 umol/L — ABNORMAL HIGH (ref 9–35)

## 2016-05-29 LAB — MRSA PCR SCREENING: MRSA BY PCR: NEGATIVE

## 2016-05-29 SURGERY — EGD (ESOPHAGOGASTRODUODENOSCOPY)
Anesthesia: General

## 2016-05-29 MED ORDER — LACTATED RINGERS IV SOLN
INTRAVENOUS | Status: DC | PRN
  Administered 2016-05-29: 10:00:00 via INTRAVENOUS

## 2016-05-29 MED ORDER — SUCCINYLCHOLINE CHLORIDE 200 MG/10ML IV SOSY
PREFILLED_SYRINGE | INTRAVENOUS | Status: DC | PRN
  Administered 2016-05-29: 100 mg via INTRAVENOUS

## 2016-05-29 MED ORDER — MIDAZOLAM HCL 2 MG/2ML IJ SOLN
INTRAMUSCULAR | Status: AC
  Filled 2016-05-29: qty 2

## 2016-05-29 MED ORDER — EPINEPHRINE HCL 0.1 MG/ML IJ SOSY
PREFILLED_SYRINGE | INTRAMUSCULAR | Status: AC
  Filled 2016-05-29: qty 10

## 2016-05-29 MED ORDER — LACTULOSE 10 GM/15ML PO SOLN: 30.0000 g | Freq: Two times a day (BID) | ORAL | Status: DC | PRN

## 2016-05-29 MED ORDER — PROPOFOL 10 MG/ML IV BOLUS
INTRAVENOUS | Status: DC | PRN
  Administered 2016-05-29: 120 mg via INTRAVENOUS

## 2016-05-29 MED ORDER — ALBUTEROL SULFATE HFA 108 (90 BASE) MCG/ACT IN AERS
INHALATION_SPRAY | RESPIRATORY_TRACT | Status: DC | PRN
  Administered 2016-05-29: 8 via RESPIRATORY_TRACT

## 2016-05-29 MED ORDER — ONDANSETRON HCL 4 MG/2ML IJ SOLN
INTRAMUSCULAR | Status: DC | PRN
  Administered 2016-05-29: 4 mg via INTRAVENOUS

## 2016-05-29 MED ORDER — FENTANYL CITRATE (PF) 250 MCG/5ML IJ SOLN
INTRAMUSCULAR | Status: AC
  Filled 2016-05-29: qty 5

## 2016-05-29 MED ORDER — PROPOFOL 10 MG/ML IV BOLUS
INTRAVENOUS | Status: AC
  Filled 2016-05-29: qty 20

## 2016-05-29 NOTE — Plan of Care (Signed)
Problem: Pain Managment: Goal: General experience of comfort will improve Outcome: Progressing Discussed pain with patient and family as well as with a spanish speaking staff member.  Return demonstration/verbal teach back.

## 2016-05-29 NOTE — Anesthesia Preprocedure Evaluation (Addendum)
Anesthesia Evaluation  Patient identified by MRN, date of birth, ID band Patient awake    Reviewed: Allergy & Precautions, NPO status , Patient's Chart, lab work & pertinent test results  Airway Mallampati: II  TM Distance: >3 FB     Dental  (+) Teeth Intact, Dental Advisory Given   Pulmonary neg pulmonary ROS,    breath sounds clear to auscultation       Cardiovascular  Rhythm:Regular Rate:Normal     Neuro/Psych Hepatic encephalopathy. Currently alert and oriented.    GI/Hepatic negative GI ROS, (+) Cirrhosis   Esophageal Varices and ascites    ,   Endo/Other    Renal/GU Renal disease (Hepatorenal syndrome)     Musculoskeletal   Abdominal   Peds  Hematology   Anesthesia Other Findings   Reproductive/Obstetrics                            Anesthesia Physical Anesthesia Plan  ASA: IV  Anesthesia Plan: General   Post-op Pain Management:    Induction: Intravenous  Airway Management Planned: Oral ETT  Additional Equipment:   Intra-op Plan:   Post-operative Plan: Extubation in OR and Possible Post-op intubation/ventilation  Informed Consent: I have reviewed the patients History and Physical, chart, labs and discussed the procedure including the risks, benefits and alternatives for the proposed anesthesia with the patient or authorized representative who has indicated his/her understanding and acceptance.   Dental advisory given  Plan Discussed with: CRNA  Anesthesia Plan Comments:         Anesthesia Quick Evaluation

## 2016-05-29 NOTE — Interval H&P Note (Signed)
History and Physical Interval Note:  05/29/2016 9:39 AM  Jaime Russell  has presented today for surgery, with the diagnosis of hematemesis. cirrhosis.  The various methods of treatment have been discussed with the patient and family. After consideration of risks, benefits and other options for treatment, the patient has consented to  Procedure(s): ESOPHAGOGASTRODUODENOSCOPY (EGD) (N/A) as a surgical intervention .  The patient's history has been reviewed, patient examined, no change in status, stable for surgery.  I have reviewed the patient's chart and labs.  Questions were answered to the patient's satisfaction.     Reeves Forth Marleta Lapierre

## 2016-05-29 NOTE — Plan of Care (Signed)
Problem: Education: Goal: Knowledge of [place] [facility] Education information/materials will improve Outcome: Progressing Discussed with patient/family plan of care with laxatives.  Patient understood with patient/family teach back.

## 2016-05-29 NOTE — Anesthesia Procedure Notes (Signed)
Procedure Name: Intubation Date/Time: 05/29/2016 10:29 AM Performed by: Daiva Eves Pre-anesthesia Checklist: Patient identified, Emergency Drugs available, Suction available, Timeout performed and Patient being monitored Patient Re-evaluated:Patient Re-evaluated prior to inductionOxygen Delivery Method: Circle system utilized Preoxygenation: Pre-oxygenation with 100% oxygen Intubation Type: IV induction, Cricoid Pressure applied and Rapid sequence Laryngoscope Size: Mac and 3 Grade View: Grade I Tube type: Subglottic suction tube Tube size: 7.5 mm Number of attempts: 1 Airway Equipment and Method: Stylet Placement Confirmation: ETT inserted through vocal cords under direct vision,  positive ETCO2,  CO2 detector and breath sounds checked- equal and bilateral Secured at: 22 cm Tube secured with: Tape Dental Injury: Teeth and Oropharynx as per pre-operative assessment

## 2016-05-29 NOTE — Procedures (Signed)
Paracentesis Procedure Note  Indications:  Ascites  Procedure Details  Informed consent was obtained after explanation of the risks and benefits of the procedure, refer to the consent documentation.  Time-out was performed immediately prior to the procedure.  The head of the bed was placed 30-45 degrees above level and the meniscus of the ascites was evaluated by bedside ultrasound and a large area of ascitic fluid was identified in the LLQ.  Local anesthesia with 1 percent lidocaine was introduced subcutaneously then deep to the skin until the parietal peritoneum was anesthetized. A parcentesis needle was introduced into this site until ascitic fluid was encountered.  Ascitic fluid and the needle were removed with minimal bleeding.  A sterile bandage was placed after holding pressure.   Findings: 39m of clear yellow ascites fluid was obtained.  The ascites fluid was sent for cell ct, culture, alk phos.        Condition:   The patient tolerated the procedure well and remains in the same condition as pre-procedure.  Complications: None; patient tolerated the procedure well.

## 2016-05-29 NOTE — Progress Notes (Addendum)
Internal Medicine Attending:   I saw and examined the patient. I reviewed the resident's note and I agree with the resident's findings and plan as documented in the resident's note.  Hepatic encephalopathy much improved today, I would say grade 1 encephalopathy currently. We can stop the lactulose enema and use oral lactulose now, please titrate to at least four bowel movements daily for now. He tolerated the EGD well today, he is awake post anesthesia. He had an esophageal varix banded. We will complete an empiric course of antibiotic and continue the octreotide and IV ppi for at least another 24 hours.   Acute kidney injury is likely due to mildly decompensated cirrhosis. Would check urine studies and trend creatinine for now, he is at risk for HRS.

## 2016-05-29 NOTE — Progress Notes (Addendum)
GI UPDATE:  EGD done earlier this morning. Procedure note done but did not upload into Epic. There was a large varix with red markings and stigmata of bleeding in the distal esophagus and multiple bands were placed. 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 Given his recent bleeding I elected not to removed gastric polyps which were likely inflammatory / hyperplastic. I suspect the most likely cause of his bleeding was the varices, the AVMs and polyps seemed to be less likely.   Encephalopathy is much improved today. Unclear if this was recently triggered by his bleeding or underlying infection (?SBP)  Recommend the following: - keep NPO today in light of recent banding, liquid diet tomorrow if stable - trend H/H - continue octreotide, protonix, and rocephine - continue lactulose enemas - large volume paracentesis today, please rule out SBP.  Call with questions or changes in his status, we will continue to follow.  Ileene Patrick, MD [facility] Pager [phone]

## 2016-05-29 NOTE — Progress Notes (Signed)
Subjective: Currently, the patient is more alert and awake. He was unable to participate in the interview, but his niece was present and translated for his daughter.  Interval Events: Pt was taken for EGD with evidence of recently bleeding varix. Also gastric AVMs and polp noted.  Objective: Vital signs in last 24 hours: Vitals:   05/29/16 1110 05/29/16 1125 05/29/16 1140 05/29/16 1145  BP: 128/71 (!) 142/67 131/65   Pulse: (!) 110 (!) 103 98 (!) 105  Resp: (!) _0 Temp:    97.8 F (36.6 C)  TempSrc:      SpO2: 100% 100% 100% 100%  Weight:      Height:       24-hour weight change: Weight change:  Intake/Output:  08/04 0701 - 08/05 0700 In: 850 [I.V.:700; IV Piggyback:50] Out: 1150 [Stool:1150]    Physical Exam: Physical Exam  Constitutional:  Lethargic by awake, chronically-ill appearing hispanic male  HENT:  Head: Normocephalic and atraumatic.  Cardiovascular: Normal rate, regular rhythm and normal heart sounds.   Pulmonary/Chest: Effort normal and breath sounds normal. No respiratory distress.  Abdominal: Soft. He exhibits distension, fluid wave and ascites. There is no tenderness. There is no rigidity, no rebound and no guarding.   Labs: CBC:  Recent Labs Lab 05/28/16 1218 05/28/16 1732 05/28/16 2235 05/29/16 0122  WBC 8.3 8.6 11.7* 11.6*  NEUTROABS 6.3  --   --   --   HGB 11.4* 10.6* 10.5* 10.1*  HCT 34.7* 32.3* 32.3* 31.8*  MCV 79.2 79.6 79.8 82.2  PLT 319 291 254 982   Metabolic Panel:  Recent Labs Lab 05/28/16 1218 05/29/16 0100 05/29/16 0122  NA 132*  --  136  K 4.4  --  4.0  CL 103  --  107  CO2 17*  --  17*  GLUCOSE 136*  --  103*  BUN 43*  --  43*  CREATININE 1.42*  --  1.58*  CALCIUM 8.8*  --  8.9  ALT 35  --  30  ALKPHOS 369*  --  309*  BILITOT 1.4*  --  2.0*  PROT 7.5  --  6.1*  ALBUMIN 2.8*  --  2.4*  AMMONIA 210* 69*  --   LABPROT 15.5*  --   --   INR 1.22  --   --    Recent Labs Lab 05/28/16 1358  GLUCAP  103*    Microbiology: BCx, UCx pending Ascitic fluid Cx, gram stain, cytology pending   Medications: Infusions: . octreotide  (SANDOSTATIN)    IV infusion 50 mcg/hr (05/28/16 2239)   Scheduled Medications: . cefTRIAXone (ROCEPHIN)  IV  1 g Intravenous Q24H  . lactulose  300 mL Rectal BID  . pantoprazole  40 mg Intravenous Q12H   PRN Medications: acetaminophen **OR** acetaminophen  Assessment/Plan: Pt is a 65 y.o. yo male with a PMHx of alcoholic cirrhosis who was admitted on 05/28/2016 with symptoms of AMS and hematemesis, which was determined to be secondary to hepatic encephalopathy. Interventions at this time will be focused on resolution of mental status changes and monitoring for infection.   1) Hepatic encephalopathy: Improving MS. Continue w/ lactulose. Possibly triggered by failure to keep home lactulose down 2/2 N/V vs SBO infection. - lactulose enemas, titrate to 4-5 BM daily  2) Hematemasis / Esophageal Varices: EGD today w/ stigmata of bleeding, banded. GI following. - maintain access w/ two large bore IVs - NPO today, then advance to full liquid - CBC in  AM - protonix IV BID, octreotide gtt  3) Alcoholic cirrhosis w/ ascites: Not a transplant candidate 2/2 SMA, portal, splenic thrombosis. Pt has refractory ascites requiring LV paracentesis q2wks. Being evaluated for [place] shunt as outpt, needs echocardiogram preop. Concern for SBO. Diagnostic paracentesis today. - f/u fluid cx, cell count, gram stain, alk phos - consider large volume paracentesis once stable - Rocephin for empiric SBO - follow BMP for SCr - possible TTE for [place] shunt preop  Length of Stay: 1 day(s) Dispo: Anticipated discharge in approximately 2 day(s).  Holley Raring, MD Pager: [phone] (7AM-5PM) 05/29/2016, 12:45 PM

## 2016-05-29 NOTE — Transfer of Care (Signed)
Immediate Anesthesia Transfer of Care Note  Patient: Jaime Russell  Procedure(s) Performed: Procedure(s): ESOPHAGOGASTRODUODENOSCOPY (EGD) (N/A)  Patient Location: PACU  Anesthesia Type:General  Level of Consciousness: awake, alert  and oriented  Airway & Oxygen Therapy: Patient Spontanous Breathing and Patient connected to nasal cannula oxygen  Post-op Assessment: Report given to RN and Post -op Vital signs reviewed and stable  Post vital signs: Reviewed and stable  Last Vitals:  Vitals:   05/29/16 0800 05/29/16 0939  BP: 132/72 (!) 152/70  Pulse: 88 89  Resp: 19 20  Temp: 36.9 C 36.8 C    Last Pain:  Vitals:   05/29/16 0939  TempSrc: Oral  PainSc:          Complications: No apparent anesthesia complications

## 2016-05-29 NOTE — H&P (View-Only) (Signed)
[facility] Consult: 1:21 PM 05/28/2016  LOS: 0 days    Referring Provider: Dr Regenia Skeeter in ED  Primary Care Physician:  Maryella Shivers, MD Primary Gastroenterologist:  In [place]: Dr Melina Copa  .  Dr Monica Martinez at [facility] has seen pt.      Reason for Consultation:  hematemesis   HPI: Jaime Russell is a 65 y.o. male.  Does not speak English and has low health literacy.  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  Marland Kitchen  Ascites requiring frequent paracentesis.  Hepatic encephalopathy.  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  Splenomegaly.  Gynecomastia.  Umbilical hernia.  06/2011 EGD: Dr Paulita Fujita.  Grade 2 distal esophageal varices, no red wale signs, diffuse portal gastropathy. 02/2016 EGD.  Dr Melina Copa.  Esophageal varices, mentioned in notes but no report so unsure if he was banded 02/2016 Colonoscopy for personal hx colon polyps.  Normal study.    Home meds include Nexium, Lactulose, Xifaxan, Iron, propanolol.  Spironolactone discontinued due to hyperkalemia. Lives with his dtr who manages his meds.  Ascites requiring frequent 7 to 8 liter paracentesis, was every 2 weeks but lately every week.  Was for repeat tap this AM with fluid studies.   Seen as outpt 05/27/16 by IR, Dr Corrie Mckusick, for consideration of [place] shunt for management of ascites. "Before proceeding with DS, I believe we should confirm that he has no unknown heart issues such as tricuspid regurgitation or decreased ejection fraction, and also we should send extra labs on his ascites to rule out SBP"  Suggested Echo, studies to r/o SBP and that these could be done closer to home in [place]. " After results, we can potentially move forward with [facility] Shunt"   However he vomited and  blood clots seen in emesis.  He is confused and altered.   Advised by IR staff to go to ED as he was c/o hematemesis and shaking.  Having altered mentation.     Ammonia 210.  Hgb is 11.4, MCV 79.  Platelets 319.  coags ok.  FOBT negative.  BUN/creat 43/1.4.  No priors to compare.       No past medical history on file.  No past surgical history on file.  Prior to Admission medications   Medication Sig Start Date End Date Taking? Authorizing Provider  esomeprazole (NEXIUM) 40 MG capsule Take 40 mg by mouth daily at 12 noon.    Historical Provider, MD  Ferrous Sulfate Dried 200 (65 Fe) MG TABS Take by mouth.    Historical Provider, MD  furosemide (LASIX) 40 MG tablet Take 40 mg by mouth.    Historical Provider, MD  lactulose (CHRONULAC) 10 GM/15ML solution Take by mouth 2 (two) times daily.    Historical Provider, MD  propranolol (INDERAL) 10 MG tablet Take 10 mg by mouth 3 (three) times daily.    Historical Provider, MD  rifaximin (XIFAXAN) 550 MG TABS tablet Take 550 mg by mouth 2 (two) times daily.    Historical Provider,  MD    Scheduled Meds: . lactulose  300 mL Rectal Once  . octreotide  50 mcg Intravenous Once   Infusions: . octreotide  (SANDOSTATIN)    IV infusion    . sodium chloride     PRN Meds:    Allergies as of 05/28/2016  . (No Known Allergies)    No family history on file.  Social History   Social History  . Marital status: Married    Spouse name: N/A  . Number of children: N/A  . Years of education: N/A   Occupational History  . Not on file.   Social History Main Topics  . Smoking status: Not on file  . Smokeless tobacco: Not on file  . Alcohol use Not on file  . Drug use: Unknown  . Sexual activity: Not on file   Other Topics Concern  . Not on file   Social History Narrative  . No narrative on file    REVIEW OF SYSTEMS: Constitutional:  Doing well yesterday, no recent confusion ENT:  No nose bleeds Pulm:  No SOB or cough CV:  No  palpitations, no LE edema.  GU:  No hematuria, no frequency GI:  Per HPI.  Up til today, eating well, no tarry stools.  No c/o abdominal pain Heme:  No unusual bleeding or bruising   Transfusions:  None in records Neuro:  No headaches, no peripheral tingling or numbness Derm:  No itching, no rash or sores.  Endocrine:  No sweats or chills.  No polyuria or dysuria Immunization:  reviewed and hepatits A and B vaccines up to date.  Travel:  None beyond local counties in last few months.    PHYSICAL EXAM: Vital signs in last 24 hours: Vitals:   05/28/16 1207 05/28/16 1237  BP: 127/88   Pulse: 85   Resp: 14   Temp:  (!) 96.8 F (36 C)   Wt Readings from Last 3 Encounters:  No data found for Wt    General: acutely ill, obtunded.  Gravely ill. Head:  No asymmetry or swelling  Eyes:  No icterus or pallor Ears:  Not able to assess hearing as he is not responding to voice  Nose:  No discharge Mouth:  Dental condition poor, moist and clear oral MM, no oral lesions or blood Neck:  No mass or JVD Lungs:  Clear in front.  resp rate is slow and shallow Heart: slightly tachy,  regular Abdomen:  Large distention, not tender.  .   Rectal: deferred GU:  I/O cath prodeuced ~ 200 to 250 cc of bright yellow urine   Musc/Skeltl: no joint redness or swelling.  Extremities:  Non-pitting pedal edema  Neurologic:  Not oriented, not following commands.  No asterixis or tremor Skin:  Sallow.  telangectasias on upper chest   Psych:  Unable to assess.   Intake/Output from previous day: No intake/output data recorded. Intake/Output this shift: No intake/output data recorded.  LAB RESULTS:  Recent Labs  05/28/16 1218  WBC 8.3  HGB 11.4*  HCT 34.7*  PLT 319   BMET Lab Results  Component Value Date   NA 132 (L) 05/28/2016   NA 138 07/14/2011   K 4.4 05/28/2016   K 3.8 07/14/2011   CL 103 05/28/2016   CL 101 07/14/2011   CO2 17 (L) 05/28/2016   CO2 28 07/14/2011   GLUCOSE 136 (H)  05/28/2016   GLUCOSE 81 07/14/2011   BUN 43 (H) 05/28/2016   BUN 8 07/14/2011  CREATININE 1.42 (H) 05/28/2016   CREATININE 0.54 07/14/2011   CALCIUM 8.8 (L) 05/28/2016   CALCIUM 8.7 07/14/2011   LFT  Recent Labs  05/28/16 1218  PROT 7.5  ALBUMIN 2.8*  AST 54*  ALT 35  ALKPHOS 369*  BILITOT 1.4*   PT/INR Lab Results  Component Value Date   INR 1.22 05/28/2016   Hepatitis Panel No results for input(s): HEPBSAG, HCVAB, HEPAIGM, HEPBIGM in the last 72 hours. C-Diff No components found for: CDIFF Lipase  No results found for: LIPASE  Drugs of Abuse  No results found for: LABOPIA, COCAINSCRNUR, LABBENZ, AMPHETMU, THCU, LABBARB   RADIOLOGY STUDIES: Dg Chest Port 1 View  Result Date: 05/28/2016 CLINICAL DATA:  Altered mental status EXAM: PORTABLE CHEST 1 VIEW COMPARISON:  None FINDINGS: The heart size appears normal. The lung volumes are low and there is atelectasis within the lung bases. No airspace consolidation. IMPRESSION: 1. Low lung volumes and bibasilar atelectasis. Electronically Signed   By: Kerby Moors M.D.   On: 05/28/2016 12:38    ENDOSCOPIC STUDIES: Per HPI  IMPRESSION:   *  Decompensated cirrhosis with:   Refractory ascites.  Initial eval for consideration or [place] shunt yesterday 05/27/16.  By exam needs another paracentesis.   Hepatic encephalopathy, acute recurrent despite rifaxan and lactulose.  Hematemesis.  Previous banding of esophageal varices before 2012.  Varices noted on 02/2016 EGD but not clear is banded then.   Portal, splenic, SMA vein thrombosis.  Has disqualified him for transplant. Also leading to refractory ascites.  Hyponatremia.  On the bright side:  No coagulopathy or thrombocytopenia.     PLAN:     *  Needs EGD with MAC.  *  Added Rocephin.  Already started on Octreotide gtt and got 40 mg of Protonix.  Will start BID IV Protonix.   *  If AMS worsens, may need ETT to protect airway.    *  Needs paracentesis with clx, stain,   Total protein, cell count, alk phos as desired by IR.  Can also have echo as requested by IR while inpt.    *  Limited discussion with dtr and niece (later is Optometrist) re code status.  Pt had said in past he wants everything done but while inpt should have family mtg with [place] as he has grim near term prognosis.    Azucena Freed  05/28/2016, 1:21 PM Pager: [phone]

## 2016-05-29 NOTE — Anesthesia Postprocedure Evaluation (Signed)
Anesthesia Post Note  Patient: Jaime Russell  Procedure(s) Performed: Procedure(s) (LRB): ESOPHAGOGASTRODUODENOSCOPY (EGD) (N/A)  Patient location during evaluation: PACU Anesthesia Type: General Level of consciousness: awake and alert Pain management: pain level controlled Vital Signs Assessment: post-procedure vital signs reviewed and stable Respiratory status: spontaneous breathing, nonlabored ventilation, respiratory function stable and patient connected to nasal cannula oxygen Cardiovascular status: blood pressure returned to baseline and stable Postop Assessment: no signs of nausea or vomiting Anesthetic complications: no    Last Vitals:  Vitals:   05/29/16 1110 05/29/16 1125  BP: 128/71 (!) 142/67  Pulse: (!) 110 (!) 103  Resp: (!) 22 15  Temp:      Last Pain:  Vitals:   05/29/16 0939  TempSrc: Oral  PainSc:                  Kennieth Rad

## 2016-05-30 ENCOUNTER — Encounter (HOSPITAL_COMMUNITY): Payer: Self-pay | Admitting: Gastroenterology

## 2016-05-30 LAB — CBC WITH DIFFERENTIAL/PLATELET
BASOS ABS: 0.1 10*3/uL (ref 0.0–0.1)
BASOS PCT: 1 %
EOS ABS: 0.2 10*3/uL (ref 0.0–0.7)
EOS PCT: 2 %
HCT: 31.2 % — ABNORMAL LOW (ref 39.0–52.0)
Hemoglobin: 9.7 g/dL — ABNORMAL LOW (ref 13.0–17.0)
Lymphocytes Relative: 8 %
Lymphs Abs: 0.8 10*3/uL (ref 0.7–4.0)
MCH: 26.3 pg (ref 26.0–34.0)
MCHC: 31.1 g/dL (ref 30.0–36.0)
MCV: 84.6 fL (ref 78.0–100.0)
MONO ABS: 1.3 10*3/uL — AB (ref 0.1–1.0)
Monocytes Relative: 14 %
Neutro Abs: 7.4 10*3/uL (ref 1.7–7.7)
Neutrophils Relative %: 75 %
PLATELETS: 233 10*3/uL (ref 150–400)
RBC: 3.69 MIL/uL — AB (ref 4.22–5.81)
RDW: 16.8 % — AB (ref 11.5–15.5)
WBC: 9.9 10*3/uL (ref 4.0–10.5)

## 2016-05-30 LAB — COMPREHENSIVE METABOLIC PANEL
ALT: 26 U/L (ref 17–63)
AST: 40 U/L (ref 15–41)
Albumin: 2.4 g/dL — ABNORMAL LOW (ref 3.5–5.0)
Alkaline Phosphatase: 245 U/L — ABNORMAL HIGH (ref 38–126)
Anion gap: 9 (ref 5–15)
BUN: 42 mg/dL — AB (ref 6–20)
CHLORIDE: 106 mmol/L (ref 101–111)
CO2: 17 mmol/L — AB (ref 22–32)
CREATININE: 1.77 mg/dL — AB (ref 0.61–1.24)
Calcium: 8.5 mg/dL — ABNORMAL LOW (ref 8.9–10.3)
GFR calc Af Amer: 45 mL/min — ABNORMAL LOW (ref >60)
GFR, EST NON AFRICAN AMERICAN: 39 mL/min — AB (ref >60)
Glucose, Bld: 203 mg/dL — ABNORMAL HIGH (ref 65–99)
POTASSIUM: 4 mmol/L (ref 3.5–5.1)
SODIUM: 132 mmol/L — AB (ref 135–145)
Total Bilirubin: 1.9 mg/dL — ABNORMAL HIGH (ref 0.3–1.2)
Total Protein: 6.5 g/dL (ref 6.5–8.1)

## 2016-05-30 MED ORDER — PHENOL 1.4 % MT LIQD
1.0000 | OROMUCOSAL | Status: DC | PRN
  Administered 2016-05-30: 1 via OROMUCOSAL
  Filled 2016-05-30: qty 177

## 2016-05-30 MED ORDER — LACTULOSE 10 GM/15ML PO SOLN
30.0000 g | Freq: Two times a day (BID) | ORAL | Status: DC | PRN
  Administered 2016-05-30: 30 g via ORAL
  Filled 2016-05-30: qty 45

## 2016-05-30 MED ORDER — SODIUM CHLORIDE 0.9 % IV SOLN
8.0000 mg | Freq: Three times a day (TID) | INTRAVENOUS | Status: DC | PRN
  Administered 2016-05-30: 8 mg via INTRAVENOUS
  Filled 2016-05-30 (×2): qty 4

## 2016-05-30 MED ORDER — LACTULOSE 10 GM/15ML PO SOLN
20.0000 g | Freq: Two times a day (BID) | ORAL | Status: DC
  Administered 2016-05-30: 20 g via ORAL
  Filled 2016-05-30 (×2): qty 30

## 2016-05-30 NOTE — Progress Notes (Signed)
Subjective: This morning, his daughter and niece were at bedside. I spoke to the patient and his family in [place]. He expressed to me that he feels he has used bathroom excessively while using lactulose and that his home doses 30 mg twice daily. Overall, he feels much better than yesterday and is alert and oriented 3.  His daughter also let me know that he gets therapeutic paracenteses done at a hospital in [place].  I explained to him that while we cannot let him go home today, we would move him to the quiet part of the hospital and mobilize him out of bed.  Objective: Vital signs in last 24 hours: Vitals:   05/30/16 0800 05/30/16 0900 05/30/16 1203 05/30/16 1418  BP: (!) 144/76 (!) 152/93 (!) 146/81 (!) 141/61  Pulse: (!) 102 (!) 103 95 93  Resp: (!) 21 18 19 20   Temp:   97.6 F (36.4 C) 98.9 F (37.2 C)  TempSrc:   Oral Oral  SpO2: 94% 94% 98% 99%  Weight:      Height:       24-hour weight change: Weight change:  Intake/Output:  08/05 0701 - 08/06 0700 In: 7035 [P.O.:180; I.V.:1000; IV Piggyback:104] Out: 900 [Urine:700; Stool:200]    Physical Exam: Physical Exam  Constitutional: No distress.  Chronically-ill appearing Hispanic male  HENT:  Head: Normocephalic and atraumatic.  Cardiovascular: Normal rate, regular rhythm and normal heart sounds.   Pulmonary/Chest: Effort normal and breath sounds normal. No respiratory distress.  Abdominal: Soft. He exhibits distension, fluid wave and ascites. There is no tenderness. There is no rigidity, no rebound and no guarding.   Labs: CBC:  Recent Labs Lab 05/28/16 1218 05/28/16 1732 05/28/16 2235 05/29/16 0122 05/29/16 1617 05/30/16 1442  WBC 8.3 8.6 11.7* 11.6*  --  9.9  NEUTROABS 6.3  --   --   --   --  7.4  HGB 11.4* 10.6* 10.5* 10.1* 10.3* 9.7*  HCT 34.7* 32.3* 32.3* 31.8*  --  31.2*  MCV 79.2 79.6 79.8 82.2  --  84.6  PLT 319 291 252 240  --  009   Metabolic Panel:  Recent Labs Lab 05/28/16 1218  05/29/16 0100 05/29/16 0122 05/29/16 1405 05/30/16 1442  NA 132*  --  136  --  132*  K 4.4  --  4.0  --  4.0  CL 103  --  107  --  106  CO2 17*  --  17*  --  17*  GLUCOSE 136*  --  103*  --  203*  BUN 43*  --  43*  --  42*  CREATININE 1.42*  --  1.58*  --  1.77*  CALCIUM 8.8*  --  8.9  --  8.5*  ALT 35  --  30  --  26  ALKPHOS 369*  --  309* 285* 245*  BILITOT 1.4*  --  2.0*  --  1.9*  PROT 7.5  --  6.1*  --  6.5  ALBUMIN 2.8*  --  2.4*  --  2.4*  AMMONIA 210* 69*  --   --   --   LABPROT 15.5*  --   --   --   --   INR 1.22  --   --   --   --     Recent Labs Lab 05/28/16 1358  GLUCAP 103*    Microbiology: BCx, UCx pending Ascitic fluid Cx, gram stain, cytology pending   Medications: Infusions:   Scheduled Medications: .  cefTRIAXone (ROCEPHIN)  IV  1 g Intravenous Q24H  . lactulose  20 g Oral BID  . pantoprazole  40 mg Intravenous Q12H   PRN Medications: acetaminophen **OR** acetaminophen, ondansetron (ZOFRAN) IV, phenol  Assessment/Plan: Pt is a 65 y.o. yo male with a PMHx of alcoholic cirrhosis who was admitted on 05/28/2016 with symptoms of AMS and hematemesis, which was determined to be secondary to hepatic encephalopathy. Interventions at this time will be focused on resolution of mental status changes and monitoring for infection.   1) Hepatic encephalopathy:  Appears resolved. Improving MS. Continue w/ lactulose. Possibly triggered by failure to keep home lactulose down 2/2 N/V vs SBO infection. - Continue lactulose 5m twice daily to titrate to 4-5 BM - Educate patient on the importance of taking his medication  2) Hematemasis / Esophageal Varices s/p banding: GI following. - maintain access w/ two large bore IVs - advance to clear liquid and full liquid today - CBC in AM - continue protonix IV BID - discontinue octreotide gtt  3) Alcoholic cirrhosis w/ ascites: Not a transplant candidate 2/2 SMA, portal, splenic thrombosis. Pt has refractory ascites  requiring LV paracentesis q2wks. Being evaluated for [place] shunt as outpt, needs echocardiogram preop. Concern for SBO. Diagnostic paracentesis today. - f/u fluid cx, cell count, gram stain, alk phos - consider large volume paracentesis tomorrow prior to discharge - Rocephin for empiric SBO but will transition to oral Bactrim twice daily - follow BMP for SCr - possible TTE for [place] shunt preop  Length of Stay: 2 day(s) Dispo: Anticipated discharge in approximately 2 day(s).  RRiccardo Dubin MD Pager: [phone](7AM-5PM) 05/30/2016, 8:21 PM

## 2016-05-30 NOTE — Op Note (Addendum)
[facility] Patient Name: Jaime Russell Procedure Date : 05/29/2016 MRN: [id number] Attending MD: Willaim Rayas. Adela Lank , MD Date of Birth: 04/30/1951 CSN: [id number] Age: 65 Admit Type: Inpatient Procedure:                Upper GI endoscopy Indications:              Hematemesis, history of cirrhosis and esophageal                            varices Providers:                Viviann Spare P. Adela Lank, MD, Will Bonnet, RN,                            Oletha Blend, Technician Referring MD:              Medicines:                Monitored Anesthesia Care Complications:            No immediate complications. Estimated blood loss:                            Minimal. Estimated Blood Loss:     Estimated blood loss was minimal. Procedure:                Pre-Anesthesia Assessment:                           - Prior to the procedure, a History and Physical                            was performed, and patient medications and                            allergies were reviewed. The patient's tolerance of                            previous anesthesia was also reviewed. The risks                            and benefits of the procedure and the sedation                            options and risks were discussed with the patient.                            All questions were answered, and informed consent                            was obtained. Prior Anticoagulants: The patient has                            taken no previous anticoagulant or antiplatelet                            agents. ASA Grade Assessment:  III - A patient with                            severe systemic disease. After reviewing the risks                            and benefits, the patient was deemed in                            satisfactory condition to undergo the procedure.                           After obtaining informed consent, the endoscope was                            passed under direct vision.  Throughout the                            procedure, the patient's blood pressure, pulse, and                            oxygen saturations were monitored continuously. The                            EG-[id number] ([id number]) scope was introduced through the                            mouth, and advanced to the second part of duodenum.                            The upper GI endoscopy was accomplished without                            difficulty. The patient tolerated the procedure                            well. Scope In: Scope Out: Findings:      Esophagogastric landmarks were identified: the Z-line was found at 38       cm, the gastroesophageal junction was found at 38 cm and the upper       extent of the gastric folds was found at 38 cm from the incisors.      One colum of large (> 5 mm) varix was found in the middle third of the       esophagus and in the lower third of the esophagus. There were also 2       other columns of smaller varices. The largest column had red markings in       the distal part of the esophagu but no active bleeding. Three bands were       successfully placed resulting in deflation of varices.The most distal       band was placed just proximal to the GEJ, the 2 other more proximal to       it. There was no bleeding during, and at the end, of the procedure.      Two 7  to 10 mm pedunculated and sessile polyps with no bleeding were       found in the gastric body and at the pylorus, one of which was       erythematous but not bleeding. Suspect benign hyperplastic or       inflammatory polyps, biopsies not taken and it was not removed given       recent bleeding.      Multiple medium no bleeding angiodysplastic lesions were found in the       gastric fundus and in the gastric body. None had any stigmata of       bleeding and were not intervened upon.      Diffuse mildly erythematous mucosa was found in the entire examined       stomach without ulceration, consistent  with portal hypertensive       gastropathy.      The exam of the stomach was otherwise normal. No gastric varices      The duodenal bulb and second portion of the duodenum were normal. Impression:               - Esophagogastric landmarks identified.                           - One large esophageal varix with red markings /                            stigmata of bleeding. Banded x 3 with deflation of                            varix. Two other small varices without stigmata of                            bleeding and not large enough to band.                           - Two gastric polyps, suspect benign inflammatory                            or hyperplastic polyps, not intervented upon given                            recent bleeding.                           - Multiple non-bleeding angiodysplastic lesions in                            the stomach. No stigmata of bleeding. These were                            not treated during this exam, as while possible, I                            think it less likely the source of his symptoms                            (  varices most likely)                           - Portal hypertensive gastropathy                           - Normal duodenal bulb and second portion of the                            duodenum.                           Overall, given stigmata on varices, suspect this is                            the most likely cause of his recent symptoms, less                            likely to be caused by gastric AVMs. Moderate Sedation:      No moderate sedation, case performed with MAC Recommendation:           - Return patient to hospital ward for ongoing care.                           - NPO today                           - Continue present medications (Octreotide, PPI,                            reocephine)                           - Serial Hgb, monitor for recurrent symptoms                           - Large volume parancetesis  if stable for ascites                            management / rule out SBP                           - Continue lactulose enemas (encephalothy                            significantly improved today)                           - GI service will continue to follow Procedure Code(s):        --- Professional ---                           [phone], Esophagogastroduodenoscopy, flexible,                            transoral; with band ligation of esophageal/gastric  varices Diagnosis Code(s):        --- Professional ---                           I85.00, Esophageal varices without bleeding                           K31.7, Polyp of stomach and duodenum                           K31.819, Angiodysplasia of stomach and duodenum                            without bleeding                           K31.89, Other diseases of stomach and duodenum                           K92.0, Hematemesis CPT copyright 2016 American Medical Association. All rights reserved. The codes documented in this report are preliminary and upon coder review may  be revised to meet current compliance requirements. Viviann SpareSteven P. Armbruster, MD 05/29/2016 11:05:11 AM This report has been signed electronically. Number of Addenda: 0

## 2016-05-30 NOTE — Progress Notes (Signed)
Called report to [facility]. Spoke to Dollar GeneralKelly RN. Pt will be going to room 37 per bed.

## 2016-05-30 NOTE — Progress Notes (Signed)
Pt transferred to [facility] bed 37 per bed by 2 nurse techs with all belongings - family aware of transfer and accompanied pt .

## 2016-05-30 NOTE — Progress Notes (Signed)
Progress Note   Subjective  Patient doing much better today. No symptoms of vomiting or melena. Passing green loose stools. On lactulose. Mental status much improved. Diagnostic paracentesis without SBP.    Objective   Vital signs in last 24 hours: Temp:  [97.6 F (36.4 C)-98.9 F (37.2 C)] 97.6 F (36.4 C) (08/06 1203) Pulse Rate:  [90-103] 95 (08/06 1203) Resp:  [11-21] 19 (08/06 1203) BP: (106-152)/(61-93) 146/81 (08/06 1203) SpO2:  [94 %-100 %] 98 % (08/06 1203) Last BM Date: 05/30/16 General:    male in NAD resting in bed Heart:  Regular rate and rhythm; no murmurs Lungs: Respirations even and unlabored, lungs CTA bilaterally anteriorly Abdomen:  Soft, distended with ascites, nontender. Extremities:  Trace to (+)1 edema. Neurologic:  Alert and oriented,   Psych:  Cooperative. Normal mood and affect.  Intake/Output from previous day: 08/05 0701 - 08/06 0700 In: 1434 [P.O.:180; I.V.:1000; IV Piggyback:104] Out: 900 [Urine:700; Stool:200] Intake/Output this shift: Total I/O In: 150 [P.O.:50; IV Piggyback:100] Out: 230 [Urine:230]  Lab Results:  Recent Labs  05/28/16 1732 05/28/16 2235 05/29/16 0122 05/29/16 1617  WBC 8.6 11.7* 11.6*  --   HGB 10.6* 10.5* 10.1* 10.3*  HCT 32.3* 32.3* 31.8*  --   PLT 291 252 240  --    BMET  Recent Labs  05/28/16 1218 05/29/16 0122  NA 132* 136  K 4.4 4.0  CL 103 107  CO2 17* 17*  GLUCOSE 136* 103*  BUN 43* 43*  CREATININE 1.42* 1.58*  CALCIUM 8.8* 8.9   LFT  Recent Labs  05/29/16 0122 05/29/16 1405  PROT 6.1*  --   ALBUMIN 2.4*  --   AST 43*  --   ALT 30  --   ALKPHOS 309* 285*  BILITOT 2.0*  --    PT/INR  Recent Labs  05/28/16 1218  LABPROT 15.5*  INR 1.22    Studies/Results: Ct Head Wo Contrast  Result Date: 05/28/2016 CLINICAL DATA:  Altered mental status EXAM: CT HEAD WITHOUT CONTRAST TECHNIQUE: Contiguous axial images were obtained from the base of the skull through the vertex  without intravenous contrast. COMPARISON:  None. FINDINGS: There are patchy areas of low attenuation throughout the subcortical and periventricular white matter compatible with chronic microvascular disease. Prominence of the sulci and ventricles identified compatible with brain atrophy. There are a few scattered parenchymal calcifications identified which may be related to old infection. The paranasal sinuses and mastoid air cells are clear. The calvarium is intact. IMPRESSION: 1. No acute intracranial abnormalities. 2. Chronic microvascular disease and brain atrophy. 3. Scattered parenchymal calcifications identified which likely reflect sequelae of old infection. Electronically Signed   By: Signa Kell M.D.   On: 05/28/2016 13:29   Dg Chest Port 1 View  Result Date: 05/28/2016 CLINICAL DATA:  Altered mental status EXAM: PORTABLE CHEST 1 VIEW COMPARISON:  None FINDINGS: The heart size appears normal. The lung volumes are low and there is atelectasis within the lung bases. No airspace consolidation. IMPRESSION: 1. Low lung volumes and bibasilar atelectasis. Electronically Signed   By: Signa Kell M.D.   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 EGD yesterday showed one large varix with red markings  and was banded x 3 as this was the most likely cause of his symptoms. He also had gastric polyps x 2 (suspect benign hyperplastic or inflammatory) which did not have stigmata of bleeding and multiple gastric AVMs, also without stigmata of bleeding, and were not treated. He has had stable Hgb post banding, encephalopathy much better, no evidence of SBP following paracentesis.   Recommend the following: - clear liquid diet okay  today - please obtain baseline labs today - CBC, CMP - trend Hgb, monitor creatinine as was rising on last draw - continue octreotide drip at least 72 hours - continue protonix 40mg  BID - continue oral lactulose for encephalopathy - patient has tight ascites, would benefit from large volume paracentesis during this admission  Call with questions, Dr. Leone PayorGessner will assume GI care of this patient tomorrow.   Ileene PatrickSteven Deziray Nabi, MD [facility] Pager [phone]

## 2016-05-30 NOTE — Progress Notes (Signed)
Patient made aware of unit protocol and procedures / daughter at bedside who speaks [place]. Patient placed on telemetry and made comfortable in bed.

## 2016-05-31 ENCOUNTER — Inpatient Hospital Stay (HOSPITAL_COMMUNITY): Payer: Medicare Other

## 2016-05-31 DIAGNOSIS — I8510 Secondary esophageal varices without bleeding: Secondary | ICD-10-CM

## 2016-05-31 DIAGNOSIS — K7040 Alcoholic hepatic failure without coma: Secondary | ICD-10-CM

## 2016-05-31 DIAGNOSIS — Z01810 Encounter for preprocedural cardiovascular examination: Secondary | ICD-10-CM

## 2016-05-31 LAB — BASIC METABOLIC PANEL
Anion gap: 8 (ref 5–15)
BUN: 40 mg/dL — AB (ref 6–20)
CHLORIDE: 104 mmol/L (ref 101–111)
CO2: 19 mmol/L — ABNORMAL LOW (ref 22–32)
CREATININE: 1.7 mg/dL — AB (ref 0.61–1.24)
Calcium: 8.5 mg/dL — ABNORMAL LOW (ref 8.9–10.3)
GFR calc Af Amer: 48 mL/min — ABNORMAL LOW (ref >60)
GFR calc non Af Amer: 41 mL/min — ABNORMAL LOW (ref >60)
GLUCOSE: 129 mg/dL — AB (ref 65–99)
POTASSIUM: 3.9 mmol/L (ref 3.5–5.1)
SODIUM: 131 mmol/L — AB (ref 135–145)

## 2016-05-31 LAB — ECHOCARDIOGRAM COMPLETE
Height: 60 in
WEIGHTICAEL: 2589.08 [oz_av]

## 2016-05-31 LAB — PATHOLOGIST SMEAR REVIEW

## 2016-05-31 MED ORDER — RIFAXIMIN 550 MG PO TABS
550.0000 mg | ORAL_TABLET | Freq: Two times a day (BID) | ORAL | Status: DC
  Administered 2016-05-31 – 2016-06-01 (×3): 550 mg via ORAL
  Filled 2016-05-31 (×3): qty 1

## 2016-05-31 MED ORDER — FUROSEMIDE 40 MG PO TABS
40.0000 mg | ORAL_TABLET | Freq: Two times a day (BID) | ORAL | Status: DC
  Administered 2016-05-31 – 2016-06-01 (×2): 40 mg via ORAL
  Filled 2016-05-31 (×3): qty 1

## 2016-05-31 MED ORDER — LACTULOSE 10 GM/15ML PO SOLN
30.0000 g | Freq: Two times a day (BID) | ORAL | Status: DC
  Administered 2016-05-31 – 2016-06-01 (×2): 30 g via ORAL
  Filled 2016-05-31 (×2): qty 45

## 2016-05-31 MED ORDER — SODIUM CHLORIDE 0.9 % IV BOLUS (SEPSIS)
1000.0000 mL | Freq: Once | INTRAVENOUS | Status: AC
  Administered 2016-05-31: 1000 mL via INTRAVENOUS

## 2016-05-31 MED ORDER — ALBUMIN HUMAN 25 % IV SOLN
25.0000 g | Freq: Once | INTRAVENOUS | Status: AC
  Administered 2016-05-31: 25 g via INTRAVENOUS
  Filled 2016-05-31: qty 100

## 2016-05-31 MED ORDER — PROPRANOLOL HCL 10 MG PO TABS
10.0000 mg | ORAL_TABLET | Freq: Two times a day (BID) | ORAL | Status: DC
  Administered 2016-05-31 – 2016-06-01 (×3): 10 mg via ORAL
  Filled 2016-05-31 (×3): qty 1

## 2016-05-31 MED ORDER — PANTOPRAZOLE SODIUM 40 MG PO TBEC
40.0000 mg | DELAYED_RELEASE_TABLET | Freq: Every day | ORAL | Status: DC
  Administered 2016-05-31 – 2016-06-01 (×2): 40 mg via ORAL
  Filled 2016-05-31 (×2): qty 1

## 2016-05-31 NOTE — Procedures (Signed)
Paracentesis Procedure Note  Indications:  Tense Ascites  Procedure Details  Informed consent was obtained after explanation of the risks and benefits of the procedure, refer to the consent documentation.  Time-out was performed immediately prior to the procedure.  The head of the bed was placed 30-45 degrees above level and the meniscus of the ascites was evaluated by bedside ultrasound and a large area of ascitic fluid was identified in the LLQ.  Local anesthesia with 1 percent lidocaine was introduced subcutaneously then deep to the skin until the parietal peritoneum was anesthetized. A parcentesis needle was introduced into this site until ascitic fluid was encountered.  Ascitic fluid and the needle were removed with minimal bleeding.  A sterile bandage was placed after holding pressure.   Findings: of clear yellow ascites fluid was obtained for therapeutic relief  The ascites fluid was not sent for analysis.        Condition:   The patient tolerated the procedure well and remains in the same condition as pre-procedure.  Complications: None; patient tolerated the procedure well.

## 2016-05-31 NOTE — Progress Notes (Signed)
Daily Rounding Note  05/31/2016, 10:21 AM  LOS: 3 days   SUBJECTIVE:   Chief complaint: confusion and hematemsis  Confusion has returned to baseline.  Regained appetite and tolerating full liquids.  No BMs since arrival despite lactulose.  Pt very weak, unable to stand without assistance, this is not his baseline  OBJECTIVE:         Vital signs in last 24 hours:    Temp:  [97.6 F (36.4 C)-99.6 F (37.6 C)] 98.9 F (37.2 C) (08/07 0535) Pulse Rate:  [88-95] 88 (08/07 0535) Resp:  [18-20] 18 (08/07 0535) BP: (113-146)/(61-81) 113/61 (08/07 0535) SpO2:  [93 %-99 %] 93 % (08/07 0535) Last BM Date: 05/31/16 Filed Weights   05/28/16 2200 05/29/16 0400  Weight: 72.8 kg (160 lb 7.9 oz) 73.4 kg (161 lb 13.1 oz)   General: looks very ill, hard to sort what is acute vs chronic   Heart: RRR Chest: clear bil.  Poor insp effort.   No dyspnea or cough Abdomen: distended, tight, hypoactive BS.  NT  Extremities: non-pitting LE edema Neuro/Psych:  Oriented x self, place, not to date.  No asterixis or tremor. Lethargic.   Intake/Output from previous day: 08/06 0701 - 08/07 0700 In: 1065 [P.O.:820; I.V.:145; IV Piggyback:100] Out: 1330 [Urine:530; Stool:800]   Lab Results:  Recent Labs  05/28/16 2235 05/29/16 0122 05/29/16 1617 05/30/16 1442  WBC 11.7* 11.6*  --  9.9  HGB 10.5* 10.1* 10.3* 9.7*  HCT 32.3* 31.8*  --  31.2*  PLT 252 240  --  233   BMET  Recent Labs  05/29/16 0122 05/30/16 1442 05/31/16 0708  NA 136 132* 131*  K 4.0 4.0 3.9  CL 107 106 104  CO2 17* 17* 19*  GLUCOSE 103* 203* 129*  BUN 43* 42* 40*  CREATININE 1.58* 1.77* 1.70*  CALCIUM 8.9 8.5* 8.5*   LFT  Recent Labs  05/28/16 1218 05/29/16 0122 05/29/16 1405 05/30/16 1442  PROT 7.5 6.1*  --  6.5  ALBUMIN 2.8* 2.4*  --  2.4*  AST 54* 43*  --  40  ALT 35 30  --  26  ALKPHOS 369* 309* 285* 245*  BILITOT 1.4* 2.0*  --  1.9*    PT/INR  Recent Labs  05/28/16 1218  LABPROT 15.5*  INR 1.22     ASSESMENT:   *  Decompensated cirrhosis with:   -- Refractory ascites.  Initial eval for consideration or [place] shunt yesterday 05/27/16.  No SBP on 45 ml diagnostic paracentesis 8/5.  -- Hepatic encephalopathy, acute/recurrent despite rifaxan and lactulose.  Improved but not yet having BMs.   -- Hematemesis.  Previous banding of esophageal varices before 2012.  Varices noted on 02/2016 EGD but not clear if banded then.   S/p 05/29/16 EGD.  Red markings, bleeding stigmata on large esoph varix.  Banded x 3.  Gastric polyps.  Multiple gastric angiodyplasia, not bleeding, not treated.  Portal gastropathy.  Portal, splenic, SMA vein thrombosis.  Has disqualified him for transplant. Also leading to refractory ascites.  Completed 72 hours of octreotide.  Bleeding appears to have stopped.  -- Hyponatremia.   *  Blood loss anemia. No transfusions to date.    *  CKD.    *  DM.     PLAN   *  Needs therapeutic paracentesis and should get Albumin replacement at time of tap.  I will leave orders for this to the teaching service.  For a few months pt has been undergoing 7 to 8 liter taps every 1 to 2 weeks.  I ordered the echo as required by IR.    *  Increased Lactulose dose.  Advanced to 2 gm Na diet.    *  Will resident alert IR of pts admission?    *  Add Rifaximin, inderal, lasix .  Takes these at home.   *  GI care will return to Dr Charm BargesButler as outpt.   *  Palliative care consult would be useful given pts terminal cirrhosis and lack of transplant candidacy.  His prognosis is poor.      Jennye MoccasinSarah Gribbin  05/31/2016, 10:21 AM Pager: [phone]    [facility] Attending   I have taken an interval history, reviewed the chart and examined the patient. I agree with the Advanced Practitioner's note, impression and recommendations.     Intermittent large volume paracentesis prn is recommendation at this time. [place] shunt  per IR Ok to [employer] [facility] Return to Dr. Charm BargesButler - primary GI [place] Signing off - no new medication recs.  Iva Booparl E. Markisha Meding, MD, [facility] [phone] after 5 PM, weekends and holidays  05/31/2016 7:36 PM

## 2016-05-31 NOTE — Evaluation (Signed)
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 Pt with hepatic encephalopathy.  Clinical Impression  Pt admitted with above diagnosis. Pt currently with functional limitations due to the deficits listed below (see PT Problem List). Pt with generalized weakness requiring minA for safe ambulation and transfers. Wife can provide assist to patient. Pt will benefit from skilled PT to increase their independence and safety with mobility to allow discharge to the venue listed below.       Follow Up Recommendations Home health PT;Supervision/Assistance - 24 hour    Equipment Recommendations  None recommended by PT    Recommendations for Other Services       Precautions / Restrictions Precautions Precautions: Fall Precaution Comments: distended abdomen from ascites Restrictions Weight Bearing Restrictions: No      Mobility  Bed Mobility Overal bed mobility: Needs Assistance Bed Mobility: Rolling;Sidelying to Sit Rolling: Supervision (v/c's for technique) Sidelying to sit: Min assist       General bed mobility comments: tactile cues and minA due to distended belly  Transfers Overall transfer level: Needs assistance Equipment used: None Transfers: Sit to/from Stand Sit to Stand: Min assist         General transfer comment: pt able to power up, minA to stabilze due to first time up  Ambulation/Gait Ambulation/Gait assistance: Min guard Ambulation Distance (Feet): 100 Feet Assistive device: 1 person hand held assist Gait Pattern/deviations: Step-through pattern;Decreased stride length;Wide base of support Gait velocity: wfl Gait velocity interpretation: at or above normal speed for age/gender General Gait Details: pt with no obvious  episodes of LOB however pt mildly unsteady requiring min guard/assist to maintain balance  Stairs            Wheelchair Mobility    Modified Rankin (Stroke Patients Only)       Balance Overall balance assessment: Needs assistance Sitting-balance support: Feet supported;No upper extremity supported Sitting balance-Leahy Scale: Fair     Standing balance support: Single extremity supported Standing balance-Leahy Scale: Poor                               Pertinent Vitals/Pain Pain Assessment: No/denies pain    Home Living Family/patient expects to be discharged to:: Private residence Living Arrangements: Spouse/significant other;Children (wife and adult daughter) Available Help at Discharge: Family;Available 24 hours/day Type of Home: Apartment Home Access: Level entry     Home Layout: One level Home Equipment: None      Prior Function Level of Independence: Independent               Hand Dominance        Extremity/Trunk Assessment   Upper Extremity Assessment: Generalized weakness           Lower Extremity Assessment: Generalized weakness      Cervical / Trunk Assessment:  (distended abdomen)  Communication   Communication: No difficulties  Cognition Arousal/Alertness: Awake/alert Behavior During Therapy: WFL for tasks assessed/performed Overall Cognitive Status: Within Functional Limits for tasks assessed                      General Comments General comments (skin integrity, edema, etc.): pt found to be soiled in the bed, pt dependen for hygiene. flexiseal removed  Exercises        Assessment/Plan    PT Assessment    PT Diagnosis Difficulty walking;Generalized weakness   PT Problem List    PT Treatment Interventions     PT Goals (Current goals can be found in the Care Plan section) Acute Rehab PT Goals Patient Stated Goal: home PT Goal Formulation: With patient/family Time For Goal Achievement:  06/07/16 Potential to Achieve Goals: Good    Frequency     Barriers to discharge        Co-evaluation               End of Session Equipment Utilized During Treatment: Gait belt Activity Tolerance: Patient tolerated treatment well Patient left: in chair;with call bell/phone within reach;with chair alarm set;with family/visitor present Nurse Communication: Mobility status         Time: 1425-1459 PT Time Calculation (min) (ACUTE ONLY): 34 min   Charges:   PT Evaluation $PT Eval Moderate Complexity: 1 Procedure PT Treatments $Gait Training: 8-22 mins   PT G CodesMarcene Brawn 05/31/2016, 3:23 PM  Lewis Shock, PT, DPT Pager #: [phone] Office #: [phone]

## 2016-05-31 NOTE — Progress Notes (Signed)
  Date: 05/31/2016  Patient name: Jaime Russell  Medical record number: [id number]  Date of birth: 1951-09-03   This patient has been seen and the plan of care was discussed with the house staff. Please see their note for complete details. I concur with their findings with the following additions/corrections: Mr Jaime Russell was seen on AM rounds. Dr Allena KatzPatel functioned as interpreter. Pt feels weak globally. He now has a rectal tube. His HE has resolved and is on lactulose and rifaxemin. He is s/p therapeutic paracentesis today - prior to procedure he had distended, tense ABD. First diagnostic paracentesis was negative for SBP. His variceal bleed was minor and he had varices banded. hgB lower but stable. For his weakness, he will get home PT. Stable for d/C home.  Burns SpainElizabeth A Butcher, MD 05/31/2016, 3:30 PM

## 2016-05-31 NOTE — Discharge Summary (Signed)
Name: Jaime Russell MRN: [id number] DOB: May 16, 1951 65 y.o. PCP: Maryella Shivers, MD  Date of Admission: 05/28/2016 11:52 AM Date of Discharge: 05/31/2016 Attending Physician: Axel Filler, MD  Discharge Diagnosis: Principal Problem:   Hepatic encephalopathy [facility]) Active Problems:   Esophageal varices ([place])   Alcoholic cirrhosis ([place])   Ascites   Discharge Medications:   Medication List    TAKE these medications   esomeprazole 40 MG capsule Commonly known as:  NEXIUM Take 40 mg by mouth daily at 12 noon.   ferrous sulfate 325 (65 FE) MG tablet Take 325 mg by mouth daily with breakfast.   furosemide 40 MG tablet Commonly known as:  LASIX Take 40 mg by mouth 2 (two) times daily.   lactulose 10 GM/15ML solution Commonly known as:  CHRONULAC Take 30 g by mouth 2 (two) times daily.   propranolol 10 MG tablet Commonly known as:  INDERAL Take 10 mg by mouth 2 (two) times daily.   XIFAXAN 550 MG Tabs tablet Generic drug:  rifaximin Take 550 mg by mouth 2 (two) times daily.      Disposition and follow-up:   Mr.Jaime Russell was discharged from [facility] in Stable condition.  At the hospital follow up visit please address:  1.  Alcoholic cirrhosis: patient with End-stage Liver Disease, not a transplant candidate, consider palliative care discussions and goals of care Refractory Ascites: f/u on [place] shunt procedure  2.  Labs / imaging needed at time of follow-up: none  3.  Pending labs/ test needing follow-up:  [place] shunt preop echo, ascites alk phos  Follow-up Appointments: Follow-up Information    BUTLER,ROBERT, MD. Call today.   Specialty:  Unknown Physician Specialty Contact information: [place] [address] [address] [facility] Course by problem list: Principal Problem:   Hepatic encephalopathy ([place]) Active Problems:   Esophageal varices (HCC)   Alcoholic cirrhosis (HCC)   Ascites   1.  Pt presented w/ AMS and several episodes of hematemasis w/ specs of blood. He was found to have hepatic encephalopathy and was treated with lactulose until mental status cleared.  GI was consulted. He was also taken for EGD and found to have a varix w/ stigmata of bleeding which was banded. He remained HDS during admission w/ stable H&H. He was evaluated for concern of SBP w/ paracentesis which showed no signs of infection. He then underwent therapeutic large volume paracentesis w/ 5 L drainage. He also underwent ehcocardiogram for preoperative evaluation of [place] Shunt requested by IR. Patient was found to be stable for discharge and released to home with family support for continued rehabilitation.  Discharge Vitals:   BP 104/64   Pulse 77   Temp 98.2 F (36.8 C) (Oral)   Resp 18   Ht 5' (1.524 m)   Wt 161 lb 13.1 oz (73.4 kg)   SpO2 98%   BMI 31.60 kg/m   Pertinent Labs, Studies, and Procedures: Diagnostic paracentesis - negative gram stain, culture w/ NGTD  Procedures Performed:  Ct Head Wo Contrast  Result Date: 05/28/2016 CLINICAL DATA:  Altered mental status EXAM: CT HEAD WITHOUT CONTRAST TECHNIQUE: Contiguous axial images were obtained from the base of the skull through the vertex without intravenous contrast. COMPARISON:  None. FINDINGS: There are patchy areas of low attenuation throughout the subcortical and periventricular white matter compatible with chronic microvascular disease. Prominence of the sulci and ventricles identified compatible with brain atrophy. There  are a few scattered parenchymal calcifications identified which may be related to old infection. The paranasal sinuses and mastoid air cells are clear. The calvarium is intact. IMPRESSION: 1. No acute intracranial abnormalities. 2. Chronic microvascular disease and brain atrophy. 3. Scattered parenchymal calcifications identified which likely reflect sequelae of old infection. Electronically Signed   By: Kerby Moors  M.D.   On: 05/28/2016 13:29   Dg Chest Port 1 View  Result Date: 05/28/2016 CLINICAL DATA:  Altered mental status EXAM: PORTABLE CHEST 1 VIEW COMPARISON:  None FINDINGS: The heart size appears normal. The lung volumes are low and there is atelectasis within the lung bases. No airspace consolidation. IMPRESSION: 1. Low lung volumes and bibasilar atelectasis. Electronically Signed   By: Kerby Moors M.D.   On: 05/28/2016 12:38    2D Echo: pending  Consultations: Treatment Team:  Manus Gunning, MD  Discharge Instructions: Discharge Instructions    Call MD for:    Complete by:  As directed   Worsening confusion or lethargy   Call MD for:  persistant nausea and vomiting    Complete by:  As directed   Call MD for:  temperature >100.4    Complete by:  As directed   Diet - low sodium heart healthy    Complete by:  As directed   Discharge instructions    Complete by:  As directed   Please remember to take your lactulose and other medications every day. If you are unable to take your medications, please let your doctor know.  Please follow up with your gasatroenterologist after you leave the hospital. We have ordered the necessary tests required by the Interventional Radiologists for your procedure to have the ascites fluid shunted.   Increase activity slowly    Complete by:  As directed      Signed: Holley Raring, MD 05/31/2016, 2:27 PM   Pager: [phone]

## 2016-05-31 NOTE — Progress Notes (Signed)
Subjective: This morning, pt is awake and oriented. He denies any acute complaints. Notes he was able to eat some food. He endorses loose stools w/ lactulose.  Objective: Vital signs in last 24 hours: Vitals:   05/30/16 1203 05/30/16 1418 05/30/16 2209 05/31/16 0535  BP: (!) 146/81 (!) 141/61 125/68 113/61  Pulse: 95 93 95 88  Resp: Temp: 97.6 F (36.4 C) 98.9 F (37.2 C) 99.6 F (37.6 C) 98.9 F (37.2 C)  TempSrc: Oral Oral Oral Oral  SpO2: 98% 99% 97% 93%  Weight:      Height:       Intake/Output:  08/06 0701 - 08/07 0700 In: 1065 [P.O.:820; I.V.:145; IV Piggyback:100] Out: 1330 [Urine:530; Stool:800]    Physical Exam: Physical Exam  Constitutional: He is oriented to person, place, and time. No distress.  Chronically-ill appearing Hispanic male  HENT:  Head: Normocephalic and atraumatic.  Cardiovascular: Normal rate, regular rhythm and normal heart sounds.   Pulmonary/Chest: Effort normal and breath sounds normal. No respiratory distress.  Abdominal: Soft. He exhibits distension, fluid wave and ascites. There is no tenderness. There is no rigidity, no rebound and no guarding.  Neurological: He is oriented to person, place, and time.   Labs: CBC:  Recent Labs Lab 05/28/16 1218 05/28/16 1732 05/28/16 2235 05/29/16 0122 05/29/16 1617 05/30/16 1442  WBC 8.3 8.6 11.7* 11.6*  --  9.9  NEUTROABS 6.3  --   --   --   --  7.4  HGB 11.4* 10.6* 10.5* 10.1* 10.3* 9.7*  HCT 34.7* 32.3* 32.3* 31.8*  --  31.2*  MCV 79.2 79.6 79.8 82.2  --  84.6  PLT 319 291 252 240  --  233   Metabolic Panel:  Recent Labs Lab 05/28/16 1218 05/29/16 0100 05/29/16 0122 05/29/16 1405 05/30/16 1442  NA 132*  --  136  --  132*  K 4.4  --  4.0  --  4.0  CL 103  --  107  --  106  CO2 17*  --  17*  --  17*  GLUCOSE 136*  --  103*  --  203*  BUN 43*  --  43*  --  42*  CREATININE 1.42*  --  1.58*  --  1.77*  CALCIUM 8.8*  --  8.9  --  8.5*  ALT 35  --  30  --  26    ALKPHOS 369*  --  309* 285* 245*  BILITOT 1.4*  --  2.0*  --  1.9*  PROT 7.5  --  6.1*  --  6.5  ALBUMIN 2.8*  --  2.4*  --  2.4*  AMMONIA 210* 69*  --   --   --   LABPROT 15.5*  --   --   --   --   INR 1.22  --   --   --   --     Recent Labs Lab 05/28/16 1358  GLUCAP 103*    Microbiology: BCx, UCx NGTD Ascitic fluid Cx NGx1D, gram stain (negative)   Medications: Infusions:   Scheduled Medications: . cefTRIAXone (ROCEPHIN)  IV  1 g Intravenous Q24H  . lactulose  20 g Oral BID  . pantoprazole  40 mg Intravenous Q12H   PRN Medications: acetaminophen **OR** acetaminophen, ondansetron (ZOFRAN) IV, phenol  Assessment/Plan: Pt is a 65 y.o. yo male with a PMHx of alcoholic cirrhosis who was admitted on 05/28/2016 with symptoms of AMS and hematemesis, which was  determined to be secondary to hepatic encephalopathy. Interventions at this time will be focused on resolution of mental status changes and monitoring for infection.   1) Hepatic encephalopathy:  Appears resolved. Improving MS. Continue w/ lactulose. Possibly triggered by failure to keep home lactulose down 2/2 N/V. - Increase to home dose lactulose 30mg  twice daily, titrate to 3-4 BM/d - restart home rifaximin for ppx, propranolol, lasix  2) Hematemasis / Esophageal Varices s/p banding: GI following. 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 Pt has refractory ascites requiring LV paracentesis q2wks. Being evaluated for [place] shunt as outpt, needs echocardiogram preop. Diagnostic paracentesis w/o evidence of infection. - large volume paracentesis prior to discharge - TTE for [place] shunt preop - f/u w/ Dr. Charm BargesButler outpt  Length of Stay: 3 day(s) Dispo: Anticipated discharge today/tomorrow.  Carolynn CommentBryan Kojo Liby, MD Pager: [phone] (7AM-5PM) 05/31/2016, 6:48 AM

## 2016-05-31 NOTE — Progress Notes (Signed)
Interpreter Wyvonnia DuskyGraciela Namihira for patient's family

## 2016-06-01 DIAGNOSIS — Z9889 Other specified postprocedural states: Secondary | ICD-10-CM

## 2016-06-01 MED ORDER — SODIUM CHLORIDE 0.9 % IV BOLUS (SEPSIS)
1000.0000 mL | Freq: Once | INTRAVENOUS | Status: AC
  Administered 2016-06-01: 1000 mL via INTRAVENOUS

## 2016-06-01 NOTE — Care Management Note (Signed)
Case Management Note  Patient Details  Name: Jaime Russell MRN: [id number] Date of Birth: [birth year]  Subjective/Objective:       Hx of alcoholic cirrhosis who was admitted on 05/28/2016 with symptoms of AMS and hematemesis, which was determined to be secondary to hepatic encephalopathy.              Action/Plan:  Discharge to home today with home health services (SW,PT).  Expected Discharge Date:06/01/2016            Expected Discharge Plan:  Home/Self Care  In-House Referral:     Discharge planning Services  CM Consult  Post Acute Care Choice:    Choice offered to:   Pt/ niece  DME Arranged:    DME Agency:     HH Arranged:    PT,SW HH Agency:   [facility]/ referral made with Lupita LeashDonna  @ [phone]  Status of Service:  Completed, signed off  If discussed at Long Length of Stay Meetings, dates discussed:    Additional Comments:  Epifanio LeschesCole, Joey Lierman Hudson, RN 06/01/2016, 11:27 AM

## 2016-06-01 NOTE — Progress Notes (Signed)
  Date: 06/01/2016  Patient name: Jaime Russell  Medical record number: [id number]  Date of birth: July 27, 1951   This patient has been seen and the plan of care was discussed with the house staff. Please see their note for complete details. I concur with their findings with the following additions/corrections: Pt's D/C was set up for yesterday but for unknown reason, pt was not D/C'd. Pt stable for D/C today.   Burns SpainElizabeth A Lachlyn Vanderstelt, MD 06/01/2016, 1:01 PM

## 2016-06-01 NOTE — Progress Notes (Signed)
   Subjective: This morning, pt is awake and oriented. He has not complaints. Reports 3 BM ON. He is ready to go home.  Objective: Vital signs in last 24 hours: Vitals:   05/31/16 0535 05/31/16 1357 05/31/16 2130 06/01/16 0528  BP: 113/61 104/64 105/61 (!) 90/51  Pulse: 88 77 75 65  Resp: 18  18 18   Temp: 98.9 F (37.2 C) 98.2 F (36.8 C) 98.4 F (36.9 C) 98.5 F (36.9 C)  TempSrc: Oral Oral Oral Oral  SpO2: 93% 98% 97% 97%  Weight:      Height:       Intake/Output:  08/07 0701 - 08/08 0700 In: 480 [P.O.:480] Out: 250 [Urine:250]    Physical Exam: Physical Exam  Constitutional: He is oriented to person, place, and time. No distress.  Chronically-ill appearing Hispanic male  Cardiovascular: Normal rate, regular rhythm and normal heart sounds.   Pulmonary/Chest: Breath sounds normal.  Abdominal: Bowel sounds are normal. He exhibits distension (improved), fluid wave and ascites. There is no tenderness. There is no rigidity.  Neurological: He is oriented to person, place, and time.   Labs: BCx, UCx, Peritoneal fluid Cx - NGTD  Medications: Infusions:   Scheduled Medications: . cefTRIAXone (ROCEPHIN)  IV  1 g Intravenous Q24H  . furosemide  40 mg Oral BID  . lactulose  30 g Oral BID  . pantoprazole  40 mg Oral Daily  . propranolol  10 mg Oral BID  . rifaximin  550 mg Oral BID   PRN Medications: acetaminophen **OR** acetaminophen, ondansetron (ZOFRAN) IV, phenol  Assessment/Plan: Pt is a 65 y.o. yo male with a PMHx of alcoholic cirrhosis who was admitted on 05/28/2016 with symptoms of AMS and hematemesis, which was determined to be secondary to hepatic encephalopathy. Interventions at this time will be focused on resolution of mental status changes and monitoring for infection.   1) Hepatic encephalopathy:  Appears resolved. Improving MS. Continue w/ lactulose. Possibly triggered by failure to keep home lactulose down 2/2 N/V. - continue home dose lactulose 30mg  twice  daily, titrate to 3-4 BM/d - continue home rifaximin for ppx, propranolol, lasix  2) Hematemasis / Esophageal Varices s/p banding: GI following. 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 Pt has refractory ascites requiring LV paracentesis q2wks. Being evaluated for [place] shunt as outpt, echocardiogram completed. Diagnostic paracentesis w/o evidence of infection. LV paracentesis w/ 5L removed. - f/u w/ Dr. Charm BargesButler outpt  Length of Stay: 4 day(s) Dispo: Anticipated discharge today.  Carolynn CommentBryan Keon Pender, MD Pager: [phone] (7AM-5PM) 06/01/2016, 7:21 AM

## 2016-06-01 NOTE — Progress Notes (Signed)
Pt and family given discharge instructions, prescriptions, and care notes. Pt verbalized understanding AEB no further questions or concerns at this time. IV was discontinued, no redness, pain, or swelling noted at this time. Telemetry discontinued and Centralized Telemetry was notified. Pt left the floor via wheelchair with staff in stable condition.

## 2016-06-01 NOTE — Progress Notes (Signed)
Paged on call. Re:  B/P 90/51. Awaiting response.

## 2016-06-01 NOTE — Progress Notes (Signed)
Pt has DC orders with home health. Case Manager has left for the day. On call Case manager was paged to see if Home Health can be set up. Pt and family aware of this.

## 2016-06-03 LAB — CULTURE, BODY FLUID-BOTTLE: CULTURE: NO GROWTH

## 2016-06-03 LAB — CULTURE, BLOOD (ROUTINE X 2)
CULTURE: NO GROWTH
CULTURE: NO GROWTH

## 2016-06-04 ENCOUNTER — Other Ambulatory Visit: Payer: Self-pay | Admitting: Interventional Radiology

## 2016-06-04 DIAGNOSIS — R188 Other ascites: Secondary | ICD-10-CM

## 2016-06-08 ENCOUNTER — Encounter (HOSPITAL_COMMUNITY): Payer: Self-pay | Admitting: Emergency Medicine

## 2016-06-08 DIAGNOSIS — K7031 Alcoholic cirrhosis of liver with ascites: Secondary | ICD-10-CM | POA: Diagnosis not present

## 2016-06-08 DIAGNOSIS — Z515 Encounter for palliative care: Secondary | ICD-10-CM | POA: Diagnosis not present

## 2016-06-08 DIAGNOSIS — R680 Hypothermia, not associated with low environmental temperature: Secondary | ICD-10-CM | POA: Diagnosis present

## 2016-06-08 DIAGNOSIS — E872 Acidosis: Secondary | ICD-10-CM | POA: Diagnosis present

## 2016-06-08 DIAGNOSIS — R197 Diarrhea, unspecified: Secondary | ICD-10-CM | POA: Diagnosis present

## 2016-06-08 DIAGNOSIS — K7290 Hepatic failure, unspecified without coma: Secondary | ICD-10-CM | POA: Diagnosis present

## 2016-06-08 DIAGNOSIS — R1031 Right lower quadrant pain: Secondary | ICD-10-CM | POA: Diagnosis not present

## 2016-06-08 DIAGNOSIS — Z66 Do not resuscitate: Secondary | ICD-10-CM | POA: Diagnosis present

## 2016-06-08 DIAGNOSIS — K659 Peritonitis, unspecified: Secondary | ICD-10-CM | POA: Diagnosis present

## 2016-06-08 DIAGNOSIS — E871 Hypo-osmolality and hyponatremia: Secondary | ICD-10-CM | POA: Diagnosis present

## 2016-06-08 DIAGNOSIS — E861 Hypovolemia: Secondary | ICD-10-CM | POA: Diagnosis present

## 2016-06-08 DIAGNOSIS — I8510 Secondary esophageal varices without bleeding: Secondary | ICD-10-CM | POA: Diagnosis present

## 2016-06-08 DIAGNOSIS — H02401 Unspecified ptosis of right eyelid: Secondary | ICD-10-CM | POA: Diagnosis present

## 2016-06-08 DIAGNOSIS — E876 Hypokalemia: Secondary | ICD-10-CM | POA: Diagnosis present

## 2016-06-08 DIAGNOSIS — K766 Portal hypertension: Secondary | ICD-10-CM | POA: Diagnosis present

## 2016-06-08 DIAGNOSIS — K767 Hepatorenal syndrome: Secondary | ICD-10-CM | POA: Diagnosis present

## 2016-06-08 DIAGNOSIS — R791 Abnormal coagulation profile: Secondary | ICD-10-CM | POA: Diagnosis present

## 2016-06-08 DIAGNOSIS — N184 Chronic kidney disease, stage 4 (severe): Secondary | ICD-10-CM | POA: Diagnosis present

## 2016-06-08 DIAGNOSIS — I81 Portal vein thrombosis: Secondary | ICD-10-CM | POA: Diagnosis present

## 2016-06-08 LAB — COMPREHENSIVE METABOLIC PANEL
ALBUMIN: 3.3 g/dL — AB (ref 3.5–5.0)
ALK PHOS: 352 U/L — AB (ref 38–126)
ALT: 34 U/L (ref 17–63)
ANION GAP: 12 (ref 5–15)
AST: 61 U/L — ABNORMAL HIGH (ref 15–41)
BUN: 39 mg/dL — AB (ref 6–20)
CALCIUM: 9.1 mg/dL (ref 8.9–10.3)
CO2: 15 mmol/L — ABNORMAL LOW (ref 22–32)
Chloride: 103 mmol/L (ref 101–111)
Creatinine, Ser: 1.74 mg/dL — ABNORMAL HIGH (ref 0.61–1.24)
GFR calc non Af Amer: 40 mL/min — ABNORMAL LOW (ref >60)
GFR, EST AFRICAN AMERICAN: 46 mL/min — AB (ref >60)
GLUCOSE: 143 mg/dL — AB (ref 65–99)
POTASSIUM: 3.1 mmol/L — AB (ref 3.5–5.1)
SODIUM: 130 mmol/L — AB (ref 135–145)
TOTAL PROTEIN: 7.6 g/dL (ref 6.5–8.1)
Total Bilirubin: 2.7 mg/dL — ABNORMAL HIGH (ref 0.3–1.2)

## 2016-06-08 LAB — CBC
HEMATOCRIT: 35.9 % — AB (ref 39.0–52.0)
HEMOGLOBIN: 12 g/dL — AB (ref 13.0–17.0)
MCH: 26 pg (ref 26.0–34.0)
MCHC: 33.4 g/dL (ref 30.0–36.0)
MCV: 77.9 fL — ABNORMAL LOW (ref 78.0–100.0)
Platelets: 260 10*3/uL (ref 150–400)
RBC: 4.61 MIL/uL (ref 4.22–5.81)
RDW: 16.3 % — AB (ref 11.5–15.5)
WBC: 10.3 10*3/uL (ref 4.0–10.5)

## 2016-06-08 LAB — LIPASE, BLOOD: LIPASE: 65 U/L — AB (ref 11–51)

## 2016-06-08 NOTE — ED Triage Notes (Signed)
Pt presents to Ed for assessment of LLQ pain that began yesterday.  Pt sts pain is constant.  Pt also states he has been having vomiting and diarrhea (pt takes lactulose at home).  Pt w/ a history of ascites, and states he had a paracentesis approx 3 days ago.

## 2016-06-08 NOTE — ED Notes (Signed)
Family states pt has been getting a paracentesis every 2 weeks.

## 2016-06-09 ENCOUNTER — Other Ambulatory Visit: Payer: Self-pay | Admitting: Radiology

## 2016-06-09 ENCOUNTER — Inpatient Hospital Stay (HOSPITAL_COMMUNITY)
Admission: EM | Admit: 2016-06-09 | Discharge: 2016-06-25 | DRG: 432 | Disposition: E | Payer: Medicare Other | Attending: Oncology | Admitting: Oncology

## 2016-06-09 ENCOUNTER — Inpatient Hospital Stay (HOSPITAL_COMMUNITY): Payer: Medicare Other

## 2016-06-09 ENCOUNTER — Encounter (HOSPITAL_COMMUNITY): Payer: Self-pay | Admitting: General Practice

## 2016-06-09 DIAGNOSIS — D689 Coagulation defect, unspecified: Secondary | ICD-10-CM

## 2016-06-09 DIAGNOSIS — R1031 Right lower quadrant pain: Secondary | ICD-10-CM | POA: Diagnosis present

## 2016-06-09 DIAGNOSIS — K920 Hematemesis: Secondary | ICD-10-CM

## 2016-06-09 DIAGNOSIS — I8510 Secondary esophageal varices without bleeding: Secondary | ICD-10-CM | POA: Diagnosis present

## 2016-06-09 DIAGNOSIS — K766 Portal hypertension: Secondary | ICD-10-CM | POA: Diagnosis present

## 2016-06-09 DIAGNOSIS — H02401 Unspecified ptosis of right eyelid: Secondary | ICD-10-CM | POA: Diagnosis present

## 2016-06-09 DIAGNOSIS — K767 Hepatorenal syndrome: Secondary | ICD-10-CM | POA: Diagnosis not present

## 2016-06-09 DIAGNOSIS — K7682 Hepatic encephalopathy: Secondary | ICD-10-CM | POA: Diagnosis present

## 2016-06-09 DIAGNOSIS — R791 Abnormal coagulation profile: Secondary | ICD-10-CM

## 2016-06-09 DIAGNOSIS — K8020 Calculus of gallbladder without cholecystitis without obstruction: Secondary | ICD-10-CM

## 2016-06-09 DIAGNOSIS — R7989 Other specified abnormal findings of blood chemistry: Secondary | ICD-10-CM

## 2016-06-09 DIAGNOSIS — K7031 Alcoholic cirrhosis of liver with ascites: Secondary | ICD-10-CM

## 2016-06-09 DIAGNOSIS — I8500 Esophageal varices without bleeding: Secondary | ICD-10-CM | POA: Diagnosis present

## 2016-06-09 DIAGNOSIS — K7460 Unspecified cirrhosis of liver: Secondary | ICD-10-CM | POA: Diagnosis present

## 2016-06-09 DIAGNOSIS — E876 Hypokalemia: Secondary | ICD-10-CM | POA: Diagnosis present

## 2016-06-09 DIAGNOSIS — K7040 Alcoholic hepatic failure without coma: Secondary | ICD-10-CM | POA: Diagnosis not present

## 2016-06-09 DIAGNOSIS — I81 Portal vein thrombosis: Secondary | ICD-10-CM | POA: Diagnosis present

## 2016-06-09 DIAGNOSIS — R945 Abnormal results of liver function studies: Secondary | ICD-10-CM

## 2016-06-09 DIAGNOSIS — K7290 Hepatic failure, unspecified without coma: Secondary | ICD-10-CM | POA: Diagnosis present

## 2016-06-09 DIAGNOSIS — Z66 Do not resuscitate: Secondary | ICD-10-CM | POA: Diagnosis present

## 2016-06-09 DIAGNOSIS — Z515 Encounter for palliative care: Secondary | ICD-10-CM | POA: Diagnosis not present

## 2016-06-09 DIAGNOSIS — R109 Unspecified abdominal pain: Secondary | ICD-10-CM | POA: Diagnosis present

## 2016-06-09 DIAGNOSIS — R197 Diarrhea, unspecified: Secondary | ICD-10-CM | POA: Diagnosis present

## 2016-06-09 DIAGNOSIS — E872 Acidosis: Secondary | ICD-10-CM | POA: Diagnosis present

## 2016-06-09 DIAGNOSIS — E871 Hypo-osmolality and hyponatremia: Secondary | ICD-10-CM | POA: Diagnosis present

## 2016-06-09 DIAGNOSIS — K659 Peritonitis, unspecified: Secondary | ICD-10-CM | POA: Diagnosis present

## 2016-06-09 DIAGNOSIS — I8511 Secondary esophageal varices with bleeding: Secondary | ICD-10-CM | POA: Diagnosis not present

## 2016-06-09 DIAGNOSIS — B9689 Other specified bacterial agents as the cause of diseases classified elsewhere: Secondary | ICD-10-CM | POA: Diagnosis not present

## 2016-06-09 DIAGNOSIS — R188 Other ascites: Secondary | ICD-10-CM | POA: Diagnosis present

## 2016-06-09 DIAGNOSIS — R680 Hypothermia, not associated with low environmental temperature: Secondary | ICD-10-CM | POA: Diagnosis present

## 2016-06-09 DIAGNOSIS — K810 Acute cholecystitis: Secondary | ICD-10-CM | POA: Diagnosis not present

## 2016-06-09 DIAGNOSIS — E861 Hypovolemia: Secondary | ICD-10-CM | POA: Diagnosis present

## 2016-06-09 DIAGNOSIS — N184 Chronic kidney disease, stage 4 (severe): Secondary | ICD-10-CM | POA: Diagnosis present

## 2016-06-09 HISTORY — DX: Essential (primary) hypertension: I10

## 2016-06-09 HISTORY — DX: Anemia, unspecified: D64.9

## 2016-06-09 LAB — PROTIME-INR
INR: 1.36
PROTHROMBIN TIME: 16.8 s — AB (ref 11.4–15.2)

## 2016-06-09 LAB — PREPARE RBC (CROSSMATCH)

## 2016-06-09 LAB — FIBRINOGEN: Fibrinogen: 256 mg/dL (ref 210–475)

## 2016-06-09 LAB — AMMONIA: AMMONIA: 133 umol/L — AB (ref 9–35)

## 2016-06-09 LAB — SAVE SMEAR

## 2016-06-09 LAB — LACTATE DEHYDROGENASE: LDH: 185 U/L (ref 98–192)

## 2016-06-09 MED ORDER — FUROSEMIDE 10 MG/ML IJ SOLN
20.0000 mg | Freq: Two times a day (BID) | INTRAMUSCULAR | Status: DC
  Administered 2016-06-09 (×2): 20 mg via INTRAVENOUS
  Filled 2016-06-09 (×2): qty 2

## 2016-06-09 MED ORDER — VITAMIN K1 10 MG/ML IJ SOLN
10.0000 mg | Freq: Once | INTRAMUSCULAR | Status: AC
  Administered 2016-06-09: 10 mg via INTRAVENOUS
  Filled 2016-06-09: qty 1

## 2016-06-09 MED ORDER — LACTULOSE ENEMA
300.0000 mL | Freq: Two times a day (BID) | ORAL | Status: DC
  Administered 2016-06-09 (×2): 300 mL via RECTAL
  Filled 2016-06-09 (×3): qty 300

## 2016-06-09 MED ORDER — DEXTROSE 5 % IV SOLN
1.0000 g | INTRAVENOUS | Status: DC
  Administered 2016-06-09: 1 g via INTRAVENOUS
  Filled 2016-06-09 (×2): qty 10

## 2016-06-09 MED ORDER — ONDANSETRON HCL 4 MG/2ML IJ SOLN
4.0000 mg | Freq: Once | INTRAMUSCULAR | Status: AC
  Administered 2016-06-09: 4 mg via INTRAVENOUS
  Filled 2016-06-09: qty 2

## 2016-06-09 MED ORDER — ENSURE ENLIVE PO LIQD
237.0000 mL | Freq: Two times a day (BID) | ORAL | Status: DC
  Administered 2016-06-10 – 2016-06-12 (×6): 237 mL via ORAL

## 2016-06-09 MED ORDER — DIATRIZOATE MEGLUMINE & SODIUM 66-10 % PO SOLN
ORAL | Status: AC
  Filled 2016-06-09: qty 30

## 2016-06-09 MED ORDER — POTASSIUM CHLORIDE 10 MEQ/100ML IV SOLN
10.0000 meq | INTRAVENOUS | Status: AC
  Administered 2016-06-09 (×5): 10 meq via INTRAVENOUS
  Filled 2016-06-09 (×5): qty 100

## 2016-06-09 MED ORDER — MORPHINE SULFATE (PF) 4 MG/ML IV SOLN
4.0000 mg | Freq: Once | INTRAVENOUS | Status: AC
  Administered 2016-06-09: 4 mg via INTRAVENOUS
  Filled 2016-06-09: qty 1

## 2016-06-09 NOTE — ED Notes (Signed)
Admitting at bedside. Admitting informed once they are finished talking with the family the lactulose will be given.

## 2016-06-09 NOTE — ED Notes (Signed)
Patient transported to CT 

## 2016-06-09 NOTE — ED Provider Notes (Signed)
[facility]-EMERGENCY DEPT Provider Note   CSN: [id number] Arrival date & time: 06/08/16  2132     History   Chief Complaint Chief Complaint  Patient presents with  . Abdominal Pain    HPI Jaime Russell is a 65 y.o. male.  HPI  This is a 65 year old male with a history of alcoholic cirrhosis and recent history of hepatic encephalopathy who presents with abdominal pain. History is taken with the help of a family member. Reports one-day history of worsening right lower quadrant abdominal pain. He did have a paracentesis while in the hospital of his right lower quadrant. There report several episodes of vomiting. No diarrhea. He is on lactulose and is having multiple bowel movements daily. No fevers noted at home.  Patient answer simple questions. He is oriented 2.  Past Medical History:  Diagnosis Date  . Alcohol abuse   . Alcoholic cirrhosis ([facility])   . Esophageal varices [facility])     Patient Active Problem List   Diagnosis Date Noted  . Abdominal pain 06/12/2016  . Ascites   . Hepatic encephalopathy (HCC) 05/28/2016  . Esophageal varices (HCC)   . Alcoholic cirrhosis of liver with ascites [facility])     Past Surgical History:  Procedure Laterality Date  . ESOPHAGOGASTRODUODENOSCOPY N/A 05/29/2016   Procedure: ESOPHAGOGASTRODUODENOSCOPY (EGD);  Surgeon: Ruffin Frederick, MD;  Location: [facility] OR;  Service: Gastroenterology;  Laterality: N/A;       Home Medications    Prior to Admission medications   Medication Sig Start Date End Date Taking? Authorizing Provider  esomeprazole (NEXIUM) 40 MG capsule Take 40 mg by mouth daily at 12 noon.   Yes Historical Provider, MD  ferrous sulfate 325 (65 FE) MG tablet Take 325 mg by mouth daily with breakfast.   Yes Historical Provider, MD  furosemide (LASIX) 40 MG tablet Take 40 mg by mouth 2 (two) times daily.    Yes Historical Provider, MD  lactulose (CHRONULAC) 10 GM/15ML solution Take 30 g by mouth 2 (two) times daily.    Yes  Historical Provider, MD  propranolol (INDERAL) 10 MG tablet Take 10 mg by mouth 2 (two) times daily.    Yes Historical Provider, MD  rifaximin (XIFAXAN) 550 MG TABS tablet Take 550 mg by mouth 2 (two) times daily.   Yes Historical Provider, MD    Family History History reviewed. No pertinent family history.  Social History Social History  Substance Use Topics  . Smoking status: Never Smoker  . Smokeless tobacco: Never Used  . Alcohol use No     Allergies   Review of patient's allergies indicates no known allergies.   Review of Systems Review of Systems  Constitutional: Negative for fever.  Respiratory: Negative for shortness of breath.   Cardiovascular: Negative for chest pain.  Gastrointestinal: Positive for abdominal pain, nausea and vomiting. Negative for constipation and diarrhea.  Psychiatric/Behavioral: Positive for confusion.  All other systems reviewed and are negative.    Physical Exam Updated Vital Signs BP 119/73 (BP Location: Right Arm)   Pulse 66   Temp 97.7 F (36.5 C) (Oral)   Resp 13   Ht 5\' 1"  (1.549 m)   Wt 176 lb (79.8 kg)   SpO2 100%   BMI 33.25 kg/m   Physical Exam  Constitutional:  Chronically ill-appearing and deconditioned  HENT:  Head: Normocephalic and atraumatic.  Mucous membranes dry  Eyes: Pupils are equal, round, and reactive to light.  Cardiovascular: Normal rate, regular rhythm and normal heart sounds.  No murmur heard. Pulmonary/Chest: Effort normal and breath sounds normal. No respiratory distress.  Abdominal: Soft. Bowel sounds are normal. There is tenderness. There is no rebound.  Right lower quadrant and mid abdominal tenderness to palpation, no guarding, no overlying erythema or skin changes, telangiectasia across the abdomen  Musculoskeletal: He exhibits no edema.  Neurological: He is alert.  Oriented only to self  Skin: Skin is warm and dry.  Bruising bilateral upper extremities  Psychiatric: He has a normal mood  and affect.  Nursing note and vitals reviewed.    ED Treatments / Results  Labs (all labs ordered are listed, but only abnormal results are displayed) Labs Reviewed  LIPASE, BLOOD - Abnormal; Notable for the following:       Result Value   Lipase 65 (*)    All other components within normal limits  COMPREHENSIVE METABOLIC PANEL - Abnormal; Notable for the following:    Sodium 130 (*)    Potassium 3.1 (*)    CO2 15 (*)    Glucose, Bld 143 (*)    BUN 39 (*)    Creatinine, Ser 1.74 (*)    Albumin 3.3 (*)    AST 61 (*)    Alkaline Phosphatase 352 (*)    Total Bilirubin 2.7 (*)    GFR calc non Af Amer 40 (*)    GFR calc Af Amer 46 (*)    All other components within normal limits  CBC - Abnormal; Notable for the following:    Hemoglobin 12.0 (*)    HCT 35.9 (*)    MCV 77.9 (*)    RDW 16.3 (*)    All other components within normal limits  PROTIME-INR - Abnormal; Notable for the following:    Prothrombin Time >90.0 (*)    INR >10.00 (*)    All other components within normal limits  BODY FLUID CULTURE  URINALYSIS, ROUTINE W REFLEX MICROSCOPIC (NOT [facility])  LACTATE DEHYDROGENASE, BODY FLUID  GLUCOSE, PERITONEAL FLUID  PROTEIN, BODY FLUID  ALBUMIN, FLUID  BODY FLUID CELL COUNT WITH DIFFERENTIAL    EKG  EKG Interpretation None       Radiology No results found.  Procedures Procedures (including critical care time)  Medications Ordered in ED Medications  morphine 4 MG/ML injection 4 mg (not administered)  ondansetron (ZOFRAN) injection 4 mg (not administered)  diatrizoate meglumine-sodium (GASTROGRAFIN) 66-10 % solution (not administered)  phytonadione (VITAMIN K) 10 mg in dextrose 5 % 50 mL IVPB (not administered)     Initial Impression / Assessment and Plan / ED Course  I have reviewed the triage vital signs and the nursing notes.  Pertinent labs & imaging results that were available during my care of the patient were reviewed by me and considered in my  medical decision making (see chart for details).  Clinical Course    Patient presents with abdominal pain. Recent admission for hepatic encephalopathy. He had 2 paracentesis while in the hospital. These were negative culture. His pain is over his right lower quadrant. No signs of peritonitis at this time. He is afebrile. Lab work without evidence of leukocytosis. Otherwise he has mild hypokalemia and mild elevation of his lipase.  Would be concerned given his history for SBP although he clinically is well-appearing. Unfortunately, his INR is greater than 10. This is likely related to his known liver disease. Scan ordered. Discussed with the resident teaching service. Will administer vitamin K in hopes to reversing his INR so he can have a paracentesis.  Will hold antibiotics at this time given that he is afebrile and stable.  Final Clinical Impressions(s) / ED Diagnoses   Final diagnoses:  Right lower quadrant abdominal pain  Coagulopathy (HCC)  Alcoholic cirrhosis of liver with ascites [facility](HCC)    New Prescriptions New Prescriptions   No medications on file     Shon Batonourtney F Horton, MD 06/20/2016 [phone]

## 2016-06-09 NOTE — ED Notes (Signed)
Transport came to pick up pt, this RN called US and requested a bedside US rt pt condition.

## 2016-06-09 NOTE — ED Notes (Signed)
Update given to Angelica RN

## 2016-06-09 NOTE — ED Notes (Signed)
Translator used.   

## 2016-06-09 NOTE — ED Notes (Signed)
CT informed that the pt is unable to finish drinking his contrast. Pt was only able to drink a few sips of the first bottle.

## 2016-06-09 NOTE — ED Notes (Signed)
[facility] Consulting civil engineerCharge RN for 2C states the pt isn't appropriate for SDU and need a higher level of care. [place] [facility] Lodge[facility] ED Charge RN notified. Admitting paged.

## 2016-06-09 NOTE — ED Notes (Signed)
Readjusted patient on stretcher to assist Saa, phleb with lab draw; visitors at bedside

## 2016-06-09 NOTE — ED Notes (Signed)
RN attempted to start an IV. This RN removed

## 2016-06-09 NOTE — ED Notes (Signed)
Secretary to page admitting provider regarding bed status.

## 2016-06-09 NOTE — ED Notes (Signed)
Admitting at bedside 

## 2016-06-09 NOTE — ED Notes (Signed)
CRITICAL VALUE ALERT  Critical value received:  INR >10  Date of notification:  August 09, 2016  Time of notification:  0555  Critical value read back:Yes.   yes  Nurse who received alert:  Kjohnson rn    Responding MD:  Dr Wilkie AyeHorton  Time MD responded:  0600  RN made aware as well at 0600

## 2016-06-09 NOTE — H&P (Signed)
Date: 06/01/2016               Patient Name:  Jaime Russell MRN: [id number]  DOB: [birth year] Age / Sex: 65 y.o., male   PCP: Charlott RakesFrancisco Hodges, MD         Medical Service: Internal Medicine Teaching Service         Attending Physician: Dr. Cyndie ChimeGranfortuna    First Contact: Dr. Carolynn CommentBryan Strelow Pager: [phone]  Second Contact: Dr. 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 His daughter is present in the room w/ him and information is obtained via phone translator from her as he is unable to answer any questions due to AMS. Starting yesterday pt has had 7 episodes of vomiting w/o any blood in emesis noted. He has become a "little" more confused than his baseline. He has not had any fevers or shortness of breath. She reports that since [facility] during which he had a paracentesis w/ 5L of volume removed, he then saw his PCP Dr. [place]Hodges [place] and had 7L of volume removed. She states patient has been compliant with low salt diet, lasix, and lactulose. He has been taking lactulose BID and has been having two bowel movements per day w/o any blood noted. He previously was on spironolactone but was taken off of this due to hyperkalemia.   In the ED his INR was found to be >10, he was given IV Vit K 10mg . Daughter denies any falls at home and denies blood in emesis and stools.   Meds:  Current Meds  Medication Sig  . esomeprazole (NEXIUM) 40 MG capsule Take 40 mg by mouth daily at 12 noon.  . ferrous sulfate 325 (65 FE) MG tablet Take 325 mg by mouth daily with breakfast.  . furosemide (LASIX) 40 MG tablet Take 40 mg by mouth 2 (two) times daily.   Marland Kitchen.  lactulose (CHRONULAC) 10 GM/15ML solution Take 30 g by mouth 2 (two) times daily.   . propranolol (INDERAL) 10 MG tablet Take 10 mg by mouth 2 (two) times daily.   . rifaximin (XIFAXAN) 550 MG TABS tablet Take 550 mg by mouth 2 (two) times daily.     Allergies: Allergies as of 06/08/2016  . (No Known Allergies)   Past Medical History:  Diagnosis Date  . Alcohol abuse   . Alcoholic cirrhosis (HCC)   . Esophageal varices (HCC)     Family History: limited by pt's AMS.   Social History: Pt lives w/ daughter, no longer drinks alcohol.   Review of Systems: A complete ROS was negative except as per HPI. Unable to perform ROS due to AMS.   Physical Exam: Blood pressure 119/73, pulse 66, temperature 97.7 F (36.5 C), temperature source Oral, resp. rate 13, height 5\' 1"  (1.549 m), weight 176 lb (79.8 kg), SpO2 100 %. Physical Exam  Constitutional: He appears well-developed.  HENT:  Head: Normocephalic and atraumatic.  Nose: Nose normal.  Pink tinged dentition, no overt blood in mouth   Eyes: Conjunctivae are normal. Pupils are equal, round, and reactive to light.  Cardiovascular: Normal rate, regular rhythm and normal  heart sounds.  Exam reveals no gallop and no friction rub.   No murmur heard. Pulmonary/Chest: Effort normal and breath sounds normal. No respiratory distress. He has no wheezes. He has no rales.  Abdominal: He exhibits distension. He exhibits no mass. There is tenderness. There is no rebound.  Tympanic to percussion   Musculoskeletal: He exhibits no edema.  Neurological:  Unable to answer questions nor follow commands. Moving all 4 extremities  Skin: Skin is warm and dry. No rash noted. He is not diaphoretic. No erythema. No pallor.     Assessment & Plan by Problem: Principal Problem:   Ascites due to alcoholic cirrhosis (HCC) Active Problems:   Hepatic encephalopathy (HCC)   Esophageal varices (HCC)   Abdominal pain   Supratherapeutic INR  Refractory  ascites 2/2 alcoholic cirrhosis-- pt has ESLD and is not a candidate for liver transplant. He is following w/ IR for possible [place] shunt. He presents w/ abdominal pain and re accumulation of ascites. He normally has 8L removed every 2 weeks, last paracentesis was 8/11 w/ 7L removed. In the ED his INR was >10, he was given 10mg  of IV vit K which should lower his INR w/i 24-48 hours. He is HD stable currently w/o any signs of active bleeding, will not administer [facility] or FFP at this time. Can consider given these if he needs an emergent paracentesis.  - admit to med surg - repeat INR tomorrow w/ paracentesis tomorrow pending INR  - blood cultures x 2 - he is on po lasix 40mg  BID at home, will continue lasix 20mg  IV BID  Supratherapeutic INR-- INR>10 likely from ESLD. Given IV vitamin K 10mg  in the ED.  - repeat INR tomorrow morning  Hepatic encephalopathy-- pt has AMS that is somewhat near his baseline according to his daughter in the room. He is compliant with lactulose at home.  - checking ammonia level - lactulose enemas BID - NPO  Hypokalemia-- 2/2 vomiting and lasix use. K 3.1.  - repleted w/ 5 runs of KCl.  - BMET in the morning  DVT ppx-- SCDs  CODE--FULL. discussed code status with daughter who is his primary Management consultantdecision maker. She states that code status was discussed at prior hospitalization and that he would want everything to be done to bring him back if his heart were to stop beating.      Dispo: Admit patient to Inpatient with expected length of stay greater than 2 midnights.  Signed: Denton Brickiana M Weldon Nouri, MD 08-Nov-2015, 7:04 AM  Pager: [phone]

## 2016-06-09 NOTE — ED Notes (Signed)
Family made aware of pts bed assignement

## 2016-06-09 NOTE — ED Notes (Signed)
Attempted report to 2C.  

## 2016-06-09 NOTE — ED Notes (Signed)
Pts daughter states that "they take 10 bottles of fluid off every two weeks".

## 2016-06-09 NOTE — ED Notes (Signed)
Called phlebotomy to get type and screen. Will give pt lactulose after getting type and screen results back per admitting.

## 2016-06-09 NOTE — ED Notes (Signed)
US at bedside

## 2016-06-10 ENCOUNTER — Other Ambulatory Visit: Payer: Self-pay | Admitting: General Surgery

## 2016-06-10 DIAGNOSIS — K7031 Alcoholic cirrhosis of liver with ascites: Secondary | ICD-10-CM | POA: Diagnosis not present

## 2016-06-10 DIAGNOSIS — K7040 Alcoholic hepatic failure without coma: Secondary | ICD-10-CM

## 2016-06-10 DIAGNOSIS — E872 Acidosis: Secondary | ICD-10-CM

## 2016-06-10 DIAGNOSIS — I8511 Secondary esophageal varices with bleeding: Secondary | ICD-10-CM

## 2016-06-10 DIAGNOSIS — E871 Hypo-osmolality and hyponatremia: Secondary | ICD-10-CM

## 2016-06-10 LAB — BODY FLUID CELL COUNT WITH DIFFERENTIAL
Eos, Fluid: 0 %
LYMPHS FL: 6 %
Monocyte-Macrophage-Serous Fluid: 52 % (ref 50–90)
Neutrophil Count, Fluid: 42 % — ABNORMAL HIGH (ref 0–25)
Total Nucleated Cell Count, Fluid: 1147 cu mm — ABNORMAL HIGH (ref 0–1000)

## 2016-06-10 LAB — COMPREHENSIVE METABOLIC PANEL
ALK PHOS: 289 U/L — AB (ref 38–126)
ALT: 29 U/L (ref 17–63)
AST: 46 U/L — ABNORMAL HIGH (ref 15–41)
Albumin: 2.8 g/dL — ABNORMAL LOW (ref 3.5–5.0)
Anion gap: 12 (ref 5–15)
BILIRUBIN TOTAL: 3.3 mg/dL — AB (ref 0.3–1.2)
BUN: 53 mg/dL — ABNORMAL HIGH (ref 6–20)
CALCIUM: 9 mg/dL (ref 8.9–10.3)
CO2: 15 mmol/L — AB (ref 22–32)
CREATININE: 2.5 mg/dL — AB (ref 0.61–1.24)
Chloride: 105 mmol/L (ref 101–111)
GFR, EST AFRICAN AMERICAN: 30 mL/min — AB (ref >60)
GFR, EST NON AFRICAN AMERICAN: 26 mL/min — AB (ref >60)
Glucose, Bld: 103 mg/dL — ABNORMAL HIGH (ref 65–99)
Potassium: 4 mmol/L (ref 3.5–5.1)
Sodium: 132 mmol/L — ABNORMAL LOW (ref 135–145)
Total Protein: 6.5 g/dL (ref 6.5–8.1)

## 2016-06-10 LAB — ALBUMIN, FLUID (OTHER)

## 2016-06-10 LAB — PROTIME-INR
INR: 1.42
Prothrombin Time: 17.5 seconds — ABNORMAL HIGH (ref 11.4–15.2)

## 2016-06-10 LAB — PROTEIN, BODY FLUID

## 2016-06-10 LAB — LACTATE DEHYDROGENASE, PLEURAL OR PERITONEAL FLUID: LD, Fluid: 203 U/L — ABNORMAL HIGH (ref 3–23)

## 2016-06-10 MED ORDER — VANCOMYCIN HCL IN DEXTROSE 750-5 MG/150ML-% IV SOLN
750.0000 mg | INTRAVENOUS | Status: DC
  Filled 2016-06-10: qty 150

## 2016-06-10 MED ORDER — HYDROMORPHONE HCL 2 MG PO TABS: 1.0000 mg | ORAL_TABLET | Freq: Four times a day (QID) | ORAL | Status: DC | PRN

## 2016-06-10 MED ORDER — HYDROMORPHONE HCL 2 MG PO TABS
1.0000 mg | ORAL_TABLET | Freq: Four times a day (QID) | ORAL | Status: DC | PRN
  Administered 2016-06-10 – 2016-06-11 (×2): 1 mg via ORAL
  Filled 2016-06-10 (×2): qty 1

## 2016-06-10 MED ORDER — PIPERACILLIN-TAZOBACTAM 3.375 G IVPB
3.3750 g | Freq: Three times a day (TID) | INTRAVENOUS | Status: DC
  Administered 2016-06-10 – 2016-06-12 (×7): 3.375 g via INTRAVENOUS
  Filled 2016-06-10 (×11): qty 50

## 2016-06-10 MED ORDER — ALBUMIN HUMAN 25 % IV SOLN
12.5000 g | Freq: Once | INTRAVENOUS | Status: AC
  Administered 2016-06-10: 12.5 g via INTRAVENOUS
  Filled 2016-06-10: qty 50

## 2016-06-10 MED ORDER — MORPHINE SULFATE (PF) 2 MG/ML IV SOLN
2.0000 mg | Freq: Once | INTRAVENOUS | Status: AC
  Administered 2016-06-10: 2 mg via INTRAVENOUS
  Filled 2016-06-10: qty 1

## 2016-06-10 MED ORDER — LACTULOSE 10 GM/15ML PO SOLN
30.0000 g | Freq: Two times a day (BID) | ORAL | Status: DC
Start: 2016-06-10 — End: 2016-06-13
  Administered 2016-06-10 – 2016-06-12 (×5): 30 g via ORAL
  Filled 2016-06-10 (×6): qty 45

## 2016-06-10 MED ORDER — RIFAXIMIN 550 MG PO TABS
550.0000 mg | ORAL_TABLET | Freq: Two times a day (BID) | ORAL | Status: DC
  Administered 2016-06-10 – 2016-06-12 (×5): 550 mg via ORAL
  Filled 2016-06-10 (×8): qty 1

## 2016-06-10 MED ORDER — LACTATED RINGERS IV BOLUS (SEPSIS)
500.0000 mL | Freq: Once | INTRAVENOUS | Status: AC
  Administered 2016-06-10: 500 mL via INTRAVENOUS

## 2016-06-10 NOTE — Progress Notes (Signed)
Subjective: This morning, pt is more awake and alert. Oriented x 1. Family at bedside. No pain. BMs going well. Agreeable to plan for paracentesis today.  Objective: Vital signs in last 24 hours: Vitals:   05/31/2016 1721 05/29/2016 1947 05/31/2016 2310 06/10/16 0357  BP: 140/65  127/76 (!) 156/87  Pulse: 74 76  94  Resp: 10  14 17   Temp: 97.8 F (36.6 C) 97.6 F (36.4 C) 98.1 F (36.7 C) 97.6 F (36.4 C)  TempSrc: Oral Axillary Oral Axillary  SpO2: 100%  100% 100%  Weight: 146 lb 2.6 oz (66.3 kg)     Height: 5\' 1"  (1.549 m)      Intake/Output:  08/16 0701 - 08/17 0700 In: 180 [I.V.:180] Out: 0     Physical Exam: Physical Exam  Constitutional: He is oriented to person, place, and time. No distress.  Chronically-ill appearing Hispanic male  Cardiovascular: Normal rate, regular rhythm and normal heart sounds.   Pulmonary/Chest: Breath sounds normal.  Abdominal: Bowel sounds are normal. He exhibits distension (improved), fluid wave and ascites. There is no tenderness. There is no rigidity.  Neurological: He is oriented to person, place, and time.   Labs: CBC:  Recent Labs Lab 06/08/16 2212  WBC 10.3  HGB 12.0*  HCT 35.9*  MCV 77.9*  PLT 260   Metabolic Panel:  Recent Labs Lab 06/08/16 2212 06/20/2016 0448 06/03/2016 0848 06/02/2016 1449 06/10/16 0403  NA 130*  --   --   --  132*  K 3.1*  --   --   --  4.0  CL 103  --   --   --  105  CO2 15*  --   --   --  15*  GLUCOSE 143*  --   --   --  103*  BUN 39*  --   --   --  53*  CREATININE 1.74*  --   --   --  2.50*  CALCIUM 9.1  --   --   --  9.0  AST 61*  --   --   --  46*  ALT 34  --   --   --  29  ALKPHOS 352*  --   --   --  289*  BILITOT 2.7*  --   --   --  3.3*  PROT 7.6  --   --   --  6.5  ALBUMIN 3.3*  --   --   --  2.8*  LIPASE 65*  --   --   --   --   AMMONIA  --   --  133*  --   --   LABPROT  --  >90.0*  --  16.8* 17.5*  INR  --  >10.00*  --  1.36 1.42   Imaging: CT w/o evidence of bleeding or  biliary obstruction. ?GB hemmorhage/sludge. Abd US w/o evidence of hepatic duct dilation    Medications:   Scheduled Medications: . cefTRIAXone (ROCEPHIN)  IV  1 g Intravenous Q24H  . feeding supplement (ENSURE ENLIVE)  237 mL Oral BID BM  . furosemide  20 mg Intravenous BID  . lactulose  300 mL Rectal BID   Assessment/Plan: Pt is a 65 y.o. yo male with a PMHx of alcoholic cirrhosis who was admitted on 06/02/2016 with symptoms of AMS and tense ascites, which was determined to be secondary to hepatic encephalopathy / alcoholic cirrhosis. Interventions at this time will be focused on resolution of mental status changes  and monitoring for infection.   Hepatic encephalopathy:  Oriented x 1, lethargic. Concern for SBP. Reportedly having 2 BM/d at home on lactulose. - transition to lactulose PO BID, titrate to 3-4 BM/d - rifaxamin 550mg  BID - ceftriaxone for SBP ppx x 5d (2/5)  Hyponatremic, non-gap Metabolic Acidosis: CO2 15, AG 12, Na 132, K 4.0. Likely hypovolemic 2/2 iatrogenic diarrhea. - LR IVF gentle rehydration 500cc bolus - d/c Lasix IV 20mg  BID  Hematemasis / Esophageal Varices s/p banding: No evidence of recurrent bleeding this admission. INR 1.4. S/p 10mg  Vit K. 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 Pt has refractory ascites Evaluated for [place] shunt as outpt. Recent LV para w/ rapid re-accumulation. - LV paracentesis w/ fluid studies today  Length of Stay: 1 day(s) Dispo: Anticipated discharge today.  Carolynn CommentBryan Ahmaya Ostermiller, MD Pager: [phone] (7AM-5PM) 06/10/2016, 6:30 AM

## 2016-06-10 NOTE — Procedures (Signed)
Paracentesis Procedure Note  Indications:  Tense Ascites  Procedure Details  Informed consent was obtained after explanation of the risks and benefits of the procedure, refer to the consent documentation.  Time-out was performed immediately prior to the procedure.  The head of the bed was placed 30-45 degrees above level and the meniscus of the ascites was evaluated by bedside ultrasound and a large area of ascitic fluid was identified in the RLQ.Marland Kitchen.  Local anesthesia with 1 percent lidocaine was introduced subcutaneously then deep to the skin until the parietal peritoneum was anesthetized. A parcentesis needle was introduced into this site until ascitic fluid was encountered.  Ascitic fluid and the needle were removed with minimal bleeding.  A sterile bandage was placed after holding pressure.   Findings: 6000mL of dark, jaundice colored ascites fluid was obtained.  The ascites fluid was sent for gram stain, culture, LDH, albumin, bilirubin.        Condition:   The patient tolerated the procedure well and remains in the same condition as pre-procedure.  Complications: None; patient tolerated the procedure well.

## 2016-06-10 NOTE — Progress Notes (Signed)
Interventional Radiology Progress Note  65 yo male with cirrhosis admitted 8/16 through the ED with increased abdominal pain and confusion.    He has a history of recurrent ascites, and VIR recently saw him in the office to initiate a work-up [facility].   Since his office visit, he has been admitted with his first episode of upper GI bleeding, with endoscopy demonstrating varices.    On this admission, he has received a diagnosis of SBP, with increased PMN's in the ascites fluid drawn today.    Both of these are [facility]-indications [facility].  Although we had been in discussion with the family regarding a transvenous liver biopsy with portal pressure measurements to evaluate the portal pressure after his recent and now first GI bleed, this is now not indicated because of the SBP.    TIPS is contraindicated with MELD of at least 20 and severe encephalopathy at baseline.    Recommendations:  - Continue current care - Continue large volume paracentesis as needed - Will discuss with family.   Call with questions/concerns.   Signed,  Yvone NeuJaime S. Loreta AveWagner, DO

## 2016-06-10 NOTE — Progress Notes (Signed)
Medicine attending: I examined this patient this morning together with resident physician Dr. Murriel HopperBrian Strelow and I concur with his evaluation and management plan which we discussed together. Significant improvement in his mental function today. He is alert, awake, able to answer questions. More complete exam now possible. Lungs are clear. No focal neurologic deficits except for mild encephalopathy with poor memory and orientation. Abdomen remains distended and tense with ascites. We will proceed with a palliative paracentesis later today. He is now alert enough to take lactulose by mouth. Patient's daughter is present. He was scheduled [facility] tomorrow. We will research this.

## 2016-06-10 NOTE — Progress Notes (Signed)
Pharmacy Antibiotic Note  Jaime Russell is a 65 y.o. male admitted on 06/05/2016 with bacterial peritonitis.  Pharmacy has been consulted for Vancomycin dosing.  Plan: Vancomycin 750 mg iv Q 24 hours Follow up Scr, cultures, progress   Height: 5\' 1"  (154.9 cm) Weight: 146 lb 2.6 oz (66.3 kg) IBW/kg (Calculated) : 52.3  Temp (24hrs), Avg:97.7 F (36.5 C), Min:97.6 F (36.4 C), Max:98.1 F (36.7 C)   Recent Labs Lab 06/08/16 2212 06/10/16 0403  WBC 10.3  --   CREATININE 1.74* 2.50*    Estimated Creatinine Clearance: 24.8 mL/min (by C-G formula based on SCr of 2.5 mg/dL).    No Known Allergies   Thank you for allowing pharmacy to be a part of this patient's care. Okey RegalLisa Diamond Jentz, PharmD [phone] 06/10/2016 4:45 PM

## 2016-06-11 ENCOUNTER — Ambulatory Visit (HOSPITAL_COMMUNITY): Payer: Medicare Other

## 2016-06-11 ENCOUNTER — Other Ambulatory Visit (HOSPITAL_COMMUNITY): Payer: Medicare Other

## 2016-06-11 LAB — CBC
HCT: 32 % — ABNORMAL LOW (ref 39.0–52.0)
Hemoglobin: 10.5 g/dL — ABNORMAL LOW (ref 13.0–17.0)
MCH: 25.8 pg — AB (ref 26.0–34.0)
MCHC: 32.8 g/dL (ref 30.0–36.0)
MCV: 78.6 fL (ref 78.0–100.0)
PLATELETS: 193 10*3/uL (ref 150–400)
RBC: 4.07 MIL/uL — AB (ref 4.22–5.81)
RDW: 16.3 % — AB (ref 11.5–15.5)
WBC: 13.3 10*3/uL — AB (ref 4.0–10.5)

## 2016-06-11 LAB — BASIC METABOLIC PANEL
ANION GAP: 12 (ref 5–15)
BUN: 58 mg/dL — AB (ref 6–20)
CALCIUM: 8.6 mg/dL — AB (ref 8.9–10.3)
CO2: 17 mmol/L — ABNORMAL LOW (ref 22–32)
Chloride: 99 mmol/L — ABNORMAL LOW (ref 101–111)
Creatinine, Ser: 2.44 mg/dL — ABNORMAL HIGH (ref 0.61–1.24)
GFR calc Af Amer: 31 mL/min — ABNORMAL LOW (ref >60)
GFR, EST NON AFRICAN AMERICAN: 27 mL/min — AB (ref >60)
GLUCOSE: 128 mg/dL — AB (ref 65–99)
POTASSIUM: 4.4 mmol/L (ref 3.5–5.1)
SODIUM: 128 mmol/L — AB (ref 135–145)

## 2016-06-11 LAB — HEPATIC FUNCTION PANEL
ALT: 26 U/L (ref 17–63)
AST: 48 U/L — ABNORMAL HIGH (ref 15–41)
Albumin: 2.6 g/dL — ABNORMAL LOW (ref 3.5–5.0)
Alkaline Phosphatase: 304 U/L — ABNORMAL HIGH (ref 38–126)
BILIRUBIN DIRECT: 2.4 mg/dL — AB (ref 0.1–0.5)
BILIRUBIN INDIRECT: 2.2 mg/dL — AB (ref 0.3–0.9)
TOTAL PROTEIN: 6.6 g/dL (ref 6.5–8.1)
Total Bilirubin: 4.6 mg/dL — ABNORMAL HIGH (ref 0.3–1.2)

## 2016-06-11 LAB — PATHOLOGIST SMEAR REVIEW: PATH REVIEW: REACTIVE

## 2016-06-11 MED ORDER — ONDANSETRON HCL 4 MG PO TABS
4.0000 mg | ORAL_TABLET | Freq: Three times a day (TID) | ORAL | Status: DC | PRN
  Administered 2016-06-11 – 2016-06-12 (×2): 4 mg via ORAL
  Filled 2016-06-11 (×2): qty 1

## 2016-06-11 NOTE — Progress Notes (Addendum)
Subjective: This morning, pt is much improved, conversive. Mentating well. Oriented x 3.  Pt received LV paracentesis yesterday w/ evidence of SBP and dark stained ascitic fluid concerning for bilirubin leak.  Objective: Vital signs in last 24 hours: Vitals:   06/10/16 1944 06/10/16 2350 06/10/16 2355 06/11/16 0410  BP: 110/62  128/66 106/68  Pulse: 89     Resp: 13   14  Temp: 97.6 F (36.4 C) 98.4 F (36.9 C)  98.2 F (36.8 C)  TempSrc: Axillary Oral  Oral  SpO2: 100%  99% 96%  Weight:      Height:       Intake/Output:  08/17 0701 - 08/18 0700 In: 50 [IV Piggyback:50] Out: 100 [Urine:100]    Physical Exam: Physical Exam  Constitutional: He is oriented to person, place, and time. No distress.  Chronically-ill appearing Hispanic male  Cardiovascular: Normal rate, regular rhythm and normal heart sounds.   Pulmonary/Chest: Breath sounds normal.  Abdominal: Bowel sounds are normal. He exhibits distension (improved), fluid wave and ascites. There is no tenderness. There is no rigidity.  Neurological: He is oriented to person, place, and time.   Labs: CBC:  Recent Labs Lab 06/08/16 2212  WBC 10.3  HGB 12.0*  HCT 35.9*  MCV 77.9*  PLT 260   Metabolic Panel:  Recent Labs Lab 06/08/16 2212 Sep 22, 2016 0448 Sep 22, 2016 0848 Sep 22, 2016 1449 06/10/16 0403  NA 130*  --   --   --  132*  K 3.1*  --   --   --  4.0  CL 103  --   --   --  105  CO2 15*  --   --   --  15*  GLUCOSE 143*  --   --   --  103*  BUN 39*  --   --   --  53*  CREATININE 1.74*  --   --   --  2.50*  CALCIUM 9.1  --   --   --  9.0  AST 61*  --   --   --  46*  ALT 34  --   --   --  29  ALKPHOS 352*  --   --   --  289*  BILITOT 2.7*  --   --   --  3.3*  PROT 7.6  --   --   --  6.5  ALBUMIN 3.3*  --   --   --  2.8*  LIPASE 65*  --   --   --   --   AMMONIA  --   --  133*  --   --   LABPROT  --  >90.0*  --  16.8* 17.5*  INR  --  >10.00*  --  1.36 1.42   Imaging: CT w/o evidence of bleeding or  biliary obstruction. ?GB hemmorhage/sludge. Abd US w/o evidence of hepatic duct dilation    Medications:   Scheduled Medications: . feeding supplement (ENSURE ENLIVE)  237 mL Oral BID BM  . lactulose  30 g Oral BID  . piperacillin-tazobactam (ZOSYN)  IV  3.375 g Intravenous Q8H  . rifaximin  550 mg Oral BID   Assessment/Plan: Pt is a 65 y.o. yo male with a PMHx of alcoholic cirrhosis who was admitted on 07/23/16 with symptoms of AMS and tense ascites, which was determined to be secondary to hepatic encephalopathy / alcoholic cirrhosis. Interventions at this time will be focused on resolution of mental status changes and monitoring for infection.  Hepatic encephalopathy:  Oriented x 1, lethargic. Concern for SBP. Reportedly having 2 BM/d at home on lactulose. - transition to lactulose PO BID, titrate to 3-4 BM/d - rifaxamin 550mg  BID  Secondary vs Spontaneous Bacterial Peritonitis: LV paracentesis yesterday w/ 1100 neutrophils on cell ct and gram stain w/o organisms. Culture pending. Afebrile, HDS. - continue ceftriaxone - add zosyn for anaerobic coverage - consider coverage for MRSA given recent frequent taps, will hold for now since clinically stable  Hyponatremic, non-gap Metabolic Acidosis: Na down to 409128 (from 132), CO2 still 17 (from 15). Likely hypervolemic hyponatremia 2/2 portal hypertension w/ acidosis 2/2 iatrogenic diarrhea. - d/c Lasix IV 20mg  BID - continue to watch  Hematemasis / Esophageal Varices s/p banding: No evidence of recurrent bleeding this admission. INR 1.4. S/p 10mg  Vit K. 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 Pt has refractory ascites Evaluated for [facility] shunt as outpt, contraindicated s/p GI bleed. IR unable to evaluate portal pressures given current SBP. Recent LV para w/ rapid re-accumulation. - continue LV paracentesis as needed -  TBili elevated at 4.6 (up from 2.2 on admission)  Length of Stay: 2 day(s) Dispo: Anticipated discharge in 1-2 days  Carolynn CommentBryan Alessander Sikorski, MD Pager: [phone] (7AM-5PM) 06/11/2016, 6:40 AM

## 2016-06-11 NOTE — Progress Notes (Signed)
Interventional Radiology Progress Note  65 yo male with ETOH cirrhosis and recurrent ascites.    First episode of SBP on this admission, with hepatic encephalopathy.  SBP is a contra-indication for [facility].   TIPS and [facility] Shunt will be deferred for repeat large volume paracentesis.   Have discussed with the family today with interpreter.  They understand the plan.     Recommendations:  - Continue current care -  Continue large volume paracentesis, as needed.   Signed,  Yvone NeuJaime S. Loreta AveWagner, DO

## 2016-06-11 NOTE — Progress Notes (Addendum)
Initial Nutrition Assessment  DOCUMENTATION CODES:   Not applicable  INTERVENTION:    Continue Ensure Enlive po BID, each supplement provides 350 kcal and 20 grams of protein  NUTRITION DIAGNOSIS:   Increased nutrient needs related to chronic illness as evidenced by estimated needs  GOAL:   Patient will meet greater than or equal to 90% of their needs  MONITOR:   PO intake, Supplement acceptance, Labs, Weight trends, I & O's  REASON FOR ASSESSMENT:   Malnutrition Screening Tool  ASSESSMENT:   65 y.o. Male with a PMHx of alcoholic cirrhosis who was admitted on 05/19/2016 with symptoms of AMS and tense ascites, which was determined to be secondary to hepatic encephalopathy / alcoholic cirrhosis. Interventions at this time will be focused on resolution of mental status changes and monitoring for infection.   Patient s/p procedure 8/17: THORACENTESIS   RD briefly spoke with family member >> states pt eating well. PO intake variable at 10-100% per flowsheet records. Per Malnutrition Screening Tool Report, pt used to weigh 175 lbs 2 months ago (16%) >> severe for time frame. Ensure Enlive oral nutrition supplements ordered BID. Unable to complete Nutrition Focused Physical Exam at this time, however, suspect possible malnutrition.  Diet Order:  Diet Heart Room service appropriate? Yes; Fluid consistency: Thin  Skin:  Reviewed, no issues  Last BM:  8/18  Height:   Ht Readings from Last 1 Encounters:  Mar 14, 2016 5\' 1"  (1.549 m)    Weight:   Wt Readings from Last 1 Encounters:  Mar 14, 2016 146 lb 2.6 oz (66.3 kg)    Wt Readings from Last 10 Encounters:  Mar 14, 2016 146 lb 2.6 oz (66.3 kg)  05/29/16 161 lb 13.1 oz (73.4 kg)    Ideal Body Weight:  47.7 kg  BMI:  Body mass index is 27.62 kg/m.  Estimated Nutritional Needs:   Kcal:  1600-1800  Protein:  80-90 gm  Fluid:  1.6-1.8 L  EDUCATION NEEDS:   No education needs identified at this time  Maureen ChattersKatie  Sebastiano Luecke, RD, LDN Pager #: [phone] After-Hours Pager #: [phone]

## 2016-06-12 DIAGNOSIS — K810 Acute cholecystitis: Secondary | ICD-10-CM

## 2016-06-12 DIAGNOSIS — K659 Peritonitis, unspecified: Secondary | ICD-10-CM

## 2016-06-12 DIAGNOSIS — K920 Hematemesis: Secondary | ICD-10-CM

## 2016-06-12 DIAGNOSIS — B9689 Other specified bacterial agents as the cause of diseases classified elsewhere: Secondary | ICD-10-CM

## 2016-06-12 DIAGNOSIS — K767 Hepatorenal syndrome: Secondary | ICD-10-CM | POA: Diagnosis not present

## 2016-06-12 LAB — CBC
HCT: 33.3 % — ABNORMAL LOW (ref 39.0–52.0)
Hemoglobin: 11.4 g/dL — ABNORMAL LOW (ref 13.0–17.0)
MCH: 26.4 pg (ref 26.0–34.0)
MCHC: 34.2 g/dL (ref 30.0–36.0)
MCV: 77.1 fL — ABNORMAL LOW (ref 78.0–100.0)
Platelets: 265 K/uL (ref 150–400)
RBC: 4.32 MIL/uL (ref 4.22–5.81)
RDW: 16.2 % — ABNORMAL HIGH (ref 11.5–15.5)
WBC: 18.5 K/uL — ABNORMAL HIGH (ref 4.0–10.5)

## 2016-06-12 LAB — COMPREHENSIVE METABOLIC PANEL
ALT: 25 U/L (ref 17–63)
AST: 47 U/L — AB (ref 15–41)
Albumin: 2.7 g/dL — ABNORMAL LOW (ref 3.5–5.0)
Alkaline Phosphatase: 313 U/L — ABNORMAL HIGH (ref 38–126)
Anion gap: 15 (ref 5–15)
BILIRUBIN TOTAL: 5.4 mg/dL — AB (ref 0.3–1.2)
BUN: 68 mg/dL — AB (ref 6–20)
CALCIUM: 8.9 mg/dL (ref 8.9–10.3)
CO2: 17 mmol/L — ABNORMAL LOW (ref 22–32)
CREATININE: 2.78 mg/dL — AB (ref 0.61–1.24)
Chloride: 93 mmol/L — ABNORMAL LOW (ref 101–111)
GFR, EST AFRICAN AMERICAN: 26 mL/min — AB (ref >60)
GFR, EST NON AFRICAN AMERICAN: 23 mL/min — AB (ref >60)
Glucose, Bld: 142 mg/dL — ABNORMAL HIGH (ref 65–99)
Potassium: 4.2 mmol/L (ref 3.5–5.1)
Sodium: 125 mmol/L — ABNORMAL LOW (ref 135–145)
TOTAL PROTEIN: 7.2 g/dL (ref 6.5–8.1)

## 2016-06-12 MED ORDER — MIDODRINE HCL 5 MG PO TABS
10.0000 mg | ORAL_TABLET | Freq: Two times a day (BID) | ORAL | Status: DC
  Administered 2016-06-12: 10 mg via ORAL
  Filled 2016-06-12: qty 2

## 2016-06-12 MED ORDER — HYDROMORPHONE HCL 1 MG/ML IJ SOLN: 2.0000 mg | INTRAMUSCULAR | Status: DC | PRN

## 2016-06-12 MED ORDER — PROMETHAZINE HCL 25 MG/ML IJ SOLN: 25.0000 mg | Freq: Four times a day (QID) | INTRAMUSCULAR | Status: DC | PRN

## 2016-06-12 MED ORDER — HYDROMORPHONE HCL 1 MG/ML IJ SOLN: 2.0000 mg | INTRAMUSCULAR | Status: DC

## 2016-06-12 MED ORDER — SODIUM CHLORIDE 0.9 % IV SOLN
INTRAVENOUS | Status: AC
  Administered 2016-06-12: 14:00:00 via INTRAVENOUS

## 2016-06-12 MED ORDER — DEXTROSE-NACL 5-0.45 % IV SOLN
INTRAVENOUS | Status: DC
  Administered 2016-06-12: 12:00:00 via INTRAVENOUS

## 2016-06-12 MED ORDER — HYDROMORPHONE HCL 1 MG/ML IJ SOLN
2.0000 mg | Freq: Four times a day (QID) | INTRAMUSCULAR | Status: DC
  Administered 2016-06-13 (×2): 1 mg via INTRAVENOUS
  Filled 2016-06-12 (×2): qty 2

## 2016-06-12 MED ORDER — HYDROMORPHONE HCL 1 MG/ML IJ SOLN
1.0000 mg | Freq: Once | INTRAMUSCULAR | Status: AC
  Administered 2016-06-12: 1 mg via INTRAVENOUS
  Filled 2016-06-12: qty 1

## 2016-06-12 MED ORDER — HYDROMORPHONE HCL 1 MG/ML IJ SOLN
1.0000 mg | INTRAMUSCULAR | Status: DC | PRN
  Administered 2016-06-12: 1 mg via INTRAVENOUS
  Filled 2016-06-12: qty 1

## 2016-06-12 MED ORDER — ONDANSETRON HCL 4 MG/2ML IJ SOLN
4.0000 mg | Freq: Three times a day (TID) | INTRAMUSCULAR | Status: DC | PRN
  Administered 2016-06-12: 4 mg via INTRAVENOUS
  Filled 2016-06-12: qty 2

## 2016-06-12 MED ORDER — PROMETHAZINE HCL 25 MG/ML IJ SOLN
12.5000 mg | Freq: Four times a day (QID) | INTRAMUSCULAR | Status: DC | PRN
  Administered 2016-06-12: 12.5 mg via INTRAVENOUS
  Filled 2016-06-12: qty 1

## 2016-06-12 MED ORDER — HYDROMORPHONE HCL 1 MG/ML IJ SOLN: 2.0000 mg | Freq: Four times a day (QID) | INTRAMUSCULAR | Status: DC

## 2016-06-12 NOTE — Progress Notes (Signed)
Medicine attending: I examined this patient today together with resident physician Dr. Delene Lollachelle Patel and I concur with his evaluation and management plan which we discussed together. He has taken a turn for the worse. Recurrent nausea. Coffee-ground emesis. Rising creatinine and bilirubin. Poor oral intake. Behind on fluids since 5 L paracentesis. Suspect element of prerenal renal insufficiency but can't exclude incipient hepatorenal failure. Ascites has reaccumulated. He is tender in the right upper abdominal quadrant. We will put him on gentle parenteral hydration. Check urine and serum osmolarity, urine sodium and creatinine. We will discuss with nephrology whether there is any benefit to adding midodrine. Abdominal ultrasound to evaluate gallbladder in view of right upper quadrant abdominal pain, leukocytosis, elevated alkaline phosphatase and rising bilirubin. No growth so far at 48 hours on peritoneal fluid sample done August 17. Fluid bilirubin still outstanding. Prognosis is guarded. Patient started is present. Status and plan discussed.

## 2016-06-12 NOTE — Progress Notes (Addendum)
Subjective: This morning, his daughter and sister were at bedside. They acknowledge that he has been having coffee-colored emesis this morning though were unsure if it was blood or not because he had chocolate ensure the night before. He does report some new localized abdominal pain and poor appetite. He is also having recurrent ascites.  Objective:  Vital signs in last 24 hours: Vitals:   06/11/16 1957 06/11/16 2324 06/12/16 0259 06/12/16 0800  BP: 115/64 (!) 104/55 105/67 96/60  Pulse: 89  90 89  Resp: 15 14 11 17   Temp: 98 F (36.7 C) 98.3 F (36.8 C) 97.7 F (36.5 C) 97.5 F (36.4 C)  TempSrc: Oral Oral Oral Oral  SpO2: 100% 100% 98% 100%  Weight:      Height:       Physical Exam  Constitutional: No distress.  Chronically ill-appearing Hispanic male  Cardiovascular: Regular rhythm.   Tachycardic  Pulmonary/Chest: Effort normal. No respiratory distress.  Abdominal: He exhibits distension (Ascitic). There is tenderness (Localized to the right upper quadrant).  Skin: He is not diaphoretic.    Assessment/Plan:  Principal Problem:   Cirrhosis of liver with ascites (HCC) Active Problems:   Hepatic encephalopathy (HCC)   Esophageal varices (HCC)   Hepatorenal syndrome (HCC)   Coffee ground emesis  Jaime Russell is a 65 year old male with cirrhosis complicated by recurrent ascites, esophageal varices, hepatic encephalopathy, hepatorenal syndrome now with worsening leukocytosis, right upper quadrant pain concerning for acute cholecystitis.  Possible acute cholecystitis: Appears consistent with rising LFTs and worsening leukocytosis today. He remains afebrile and is on broad-spectrum antibiotic coverage. Abdominal ultrasound on admission was notable for mild gallbladder wall thickening the sonographic Murphy sign was negative, and acute cholecystitis cannot be excluded from these imaging findings alone. Spoke with Dr. Lacey Jensensuei [General Surgery] who recommended conservative  treatment with bowel rest and IV antibiotics though can assess gallbladder function with HIDA scan. -Continue Zosyn IV -Order HIDA scan  Bacterial peritonitis: Unclear spontaneous or secondary to multiple paracenteses he has received in the last several weeks. Culture remains without growth. -Zosyn IV as noted above   Hepatic encephalopathy: Mental status appears improved today though is tenuous and dependent on whether he is able to keep down oral lactulose. -Transition to lactulose enemas if he is unable to tolerate oral intake.  Esophageal varices s/p banding: Hemoglobin stable at 10-11 which is reassuring that his vomitus is not hematemesis. -Try phenergan 12.5mg  every 6 hours as needed for nausea and vomiting  Hepatorenal syndrome with hyponatremia and anion gap metabolic acidosis: Creatinine increased to 2.8 today from 2.5 over the last 2 days. He continues to have worsening hyponatremia. -Start normal saline at 75 mL/hour per nephrology recommendations -Check urine osmolality, sodium to calculate fractional excretion of sodium to better assess his renal injury -Start midodrine 10 mg twice daily to improve renal perfusion -Consult nephrology formally tomorrow if he does not improve  Dispo: Anticipated discharge in approximately 2-3 day(s). Prognosis guarded.  Jaime Arbourushil V Shellia Hartl, MD 06/12/2016, 3:02 PM Pager: @[email or web address] @   ADDENDUM 06/12/2016  6:28 PM:  Around 6 PM today, I had a family meeting with his daughter Jaime Russell [and her boyfriend], his son Jaime Russell, his niece Jaime Russell regarding the patient's health. He also felt that he was in the terminal stages of his illness and have been preparing for this. They felt that this was coming given the frequent paracenteses he has received in the last couple weeks. They would like to prioritize comfort above all else  and that he does not suffer. He would also like to be at home with his extended family.   In line with these new goals of care, I  recommended the following changes in which they were with agreement.  -Arrange for home hospice tomorrow -Look into one final paracentesis prior to discharge -Discontinue the HIDA scan planned for tomorrow in line with the new plan of care. -Transition to a DO NOT RESUSCITATE code status -Discontinue Zofran and increase Phenergan to 25mg  every 6 hours as needed and prior to medication. As this medication is IV, we will need to discontinue prior to discharge though hoping removal of ascites will help him improve his oral intake. -Schedule Dilaudid 2mg  IV every 6 hours as patient was in pain earlier today with 1mg  IV as breakthrough

## 2016-06-13 LAB — BODY FLUID CULTURE
Culture: NO GROWTH
SPECIAL REQUESTS: NORMAL

## 2016-06-13 LAB — TYPE AND SCREEN
ABO/RH(D): O POS
ANTIBODY SCREEN: NEGATIVE
UNIT DIVISION: 0
Unit division: 0

## 2016-06-14 LAB — CULTURE, BLOOD (ROUTINE X 2)
CULTURE: NO GROWTH
Culture: NO GROWTH

## 2016-06-15 DIAGNOSIS — K8020 Calculus of gallbladder without cholecystitis without obstruction: Secondary | ICD-10-CM

## 2016-06-17 LAB — TOTAL BILIRUBIN, BODY FLUID: TOTBILIFLUID: 6.4 mg/dL

## 2016-06-25 NOTE — Progress Notes (Signed)
   08-19-16 0300  Clinical Encounter Type  Visited With Patient and family together  Visit Type Patient actively dying  Referral From Nurse  Consult/Referral To Chaplain  Spiritual Encounters  Spiritual Needs Emotional;Grief support  Stress Factors  Family Stress Factors Exhausted;Health changes;Loss;Major life changes   Chaplain responded to call on floor. Patient is actively dying.  Chaplain offered ministry of presence but family did not need me at this time.  [name] 08-19-16 3:06 AM

## 2016-06-25 NOTE — Progress Notes (Signed)
  I received a page from the RN at around 3:40 AM saying that Jaime Russell's time of death was 2:53 AM, and that he was DNR when he passed.  I went to the room, met with the family and offered condolences. I explained that I will inform the day team of Jaime Russell's death. Advised to call me for any reason.  Signed Burgess Estelle PGY 2 Internal Medicine

## 2016-06-25 NOTE — Discharge Summary (Signed)
  Name: Jaime Russell MRN: [id number] DOB: 26-Dec-1950 65 y.o.  Date of Admission: 02-26-16  3:53 AM Date of Discharge: 06/06/2016 Attending Physician: Levert FeinsteinJames M Granfortuna, MD  Discharge Diagnosis: Principal Problem:   Cirrhosis of liver with ascites (HCC) Active Problems:   Hepatic encephalopathy (HCC)   Esophageal varices (HCC)   Hepatorenal syndrome (HCC)   Coffee ground emesis  Cause of death: Hepatorenal Syndrome 2/2 Cirrhosis of the Liver, contributed by Peritonitis Time of death: 2:53AM  Disposition and follow-up:   Mr.Azavion Levora Dredgevila Russell [facility] condition.    Hospital Course: [name] presented with concerns for worsening mental status in the setting of his alcoholic cirrhosis. He was recently discharged from our facility one week ago when he presented for the same. During his hospitalization his mental status improved with administration of lactulose, but he was noted to have worsening leukocytosis and significant right upper quadrant abdominal pain. He underwent large volume paracentesis for symptomatically relief of his tense ascites. He was treated with IV antibiotics for presumed peritonitis. During his admission his renal function continued to decline likely secondary to hepatorenal syndrome. Goals of care conversation with the family established desire for comfort care on discharge. However prior to his discharge [name] expired from the above.  Signed: Carolynn CommentBryan Reynoldo Mainer, MD 06/15/2016, 7:36 AM

## 2016-06-25 NOTE — Progress Notes (Signed)
Pt expired 0253 with family at bedside, Dr Linna CapriceSaraya, carolinal donor notified.

## 2016-06-25 NOTE — Progress Notes (Signed)
Family requesting pain med for pt, daughter at bedside. Pt somnolent, rr shallow tachypnic, briefly opens and directs eyes in direction of my voice. Family tearful, emotional support given. Pt vital signs and comfort measures discussed with family.

## 2016-06-25 DEATH — deceased

## 2016-10-28 ENCOUNTER — Encounter: Payer: Self-pay | Admitting: Interventional Radiology

## 2017-01-02 IMAGING — CT CT HEAD W/O CM
3 of 4 series · 17 of 47 positions shown, 20 images · non-contrast
Comparison: None.

CLINICAL DATA: Altered mental status

EXAM:
CT HEAD WITHOUT CONTRAST
TECHNIQUE: Contiguous axial images were obtained from the base of the skull
through the vertex without intravenous contrast.

[Series 201: head w/o, idose (1) · axial · non-contrast · 0.47mm/px · z∈[+94,+214]mm · 11 of 30 slices shown, 14 images]
[im 3/30  brain]
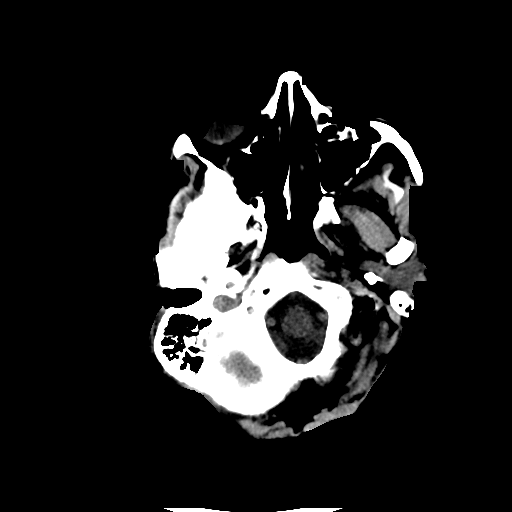
[im 3/30  bone]
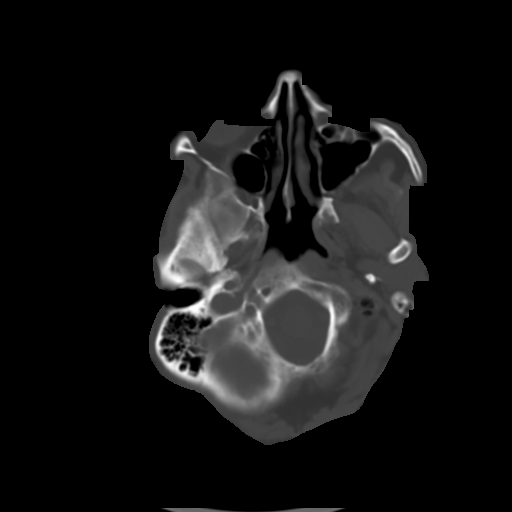
[im 5/30  brain]
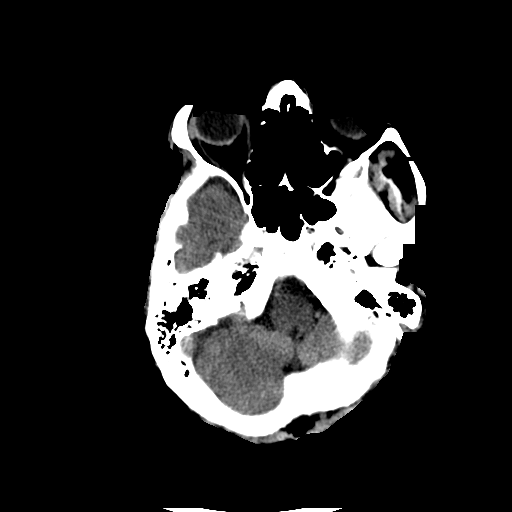
[im 7/30  brain]
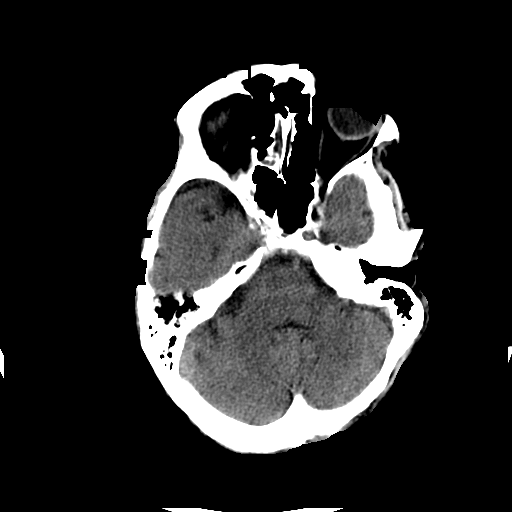
[im 11/30  brain]
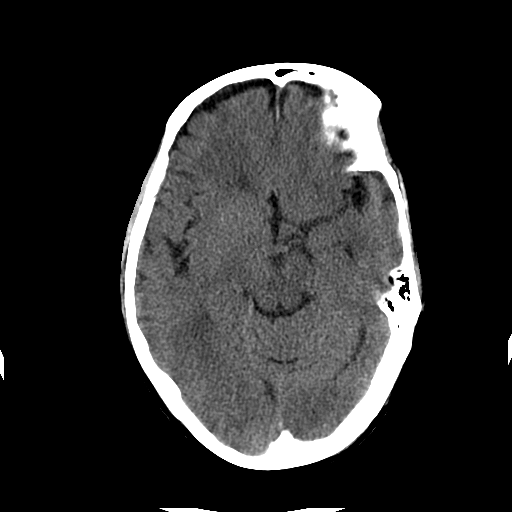
[im 13/30  brain]
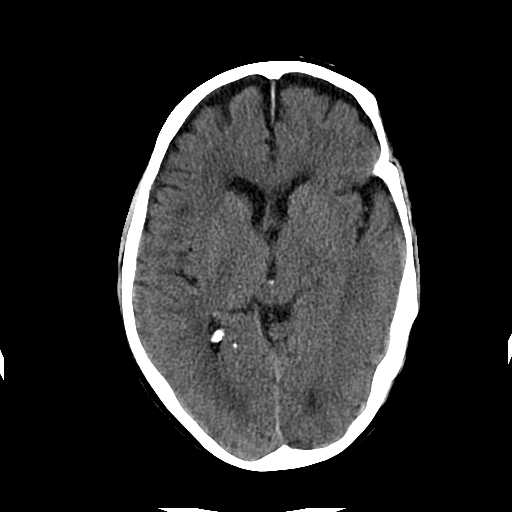
[im 13/30  bone]
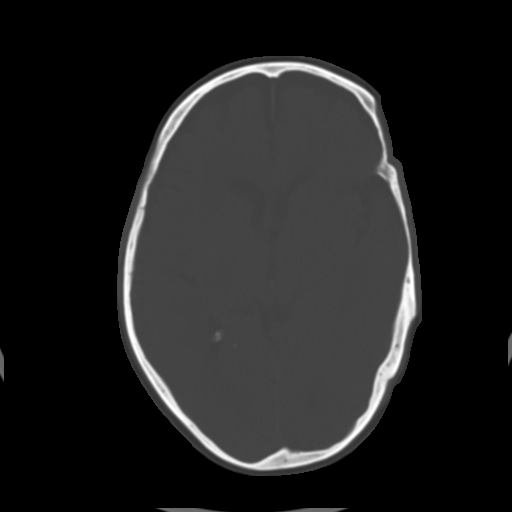
[im 15/30  brain]
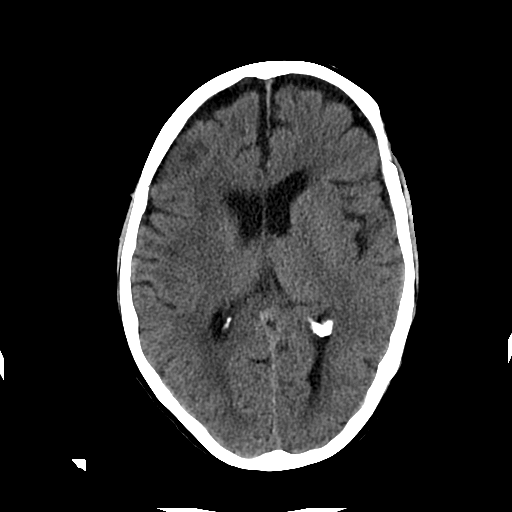
[im 17/30  brain]
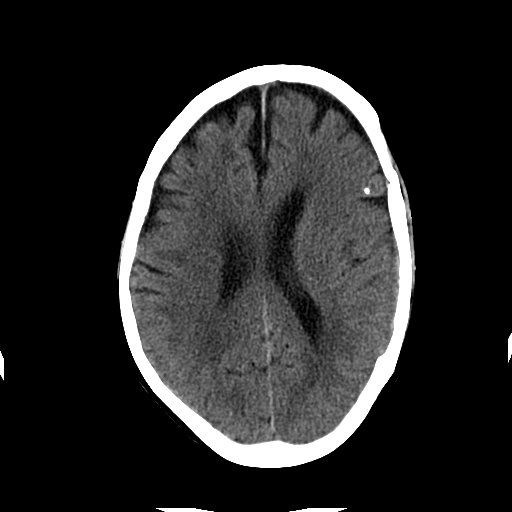
[im 19/30  brain]
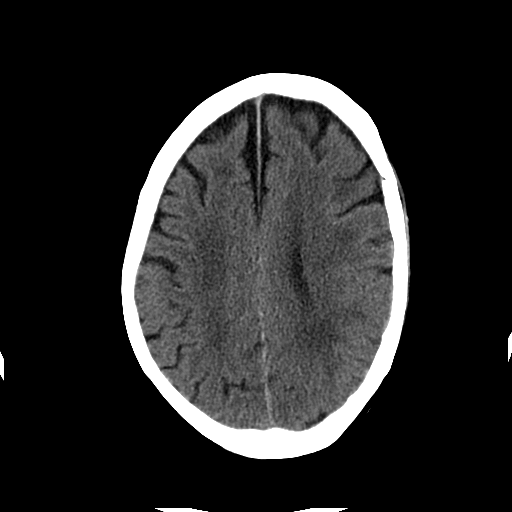
[im 23/30  brain]
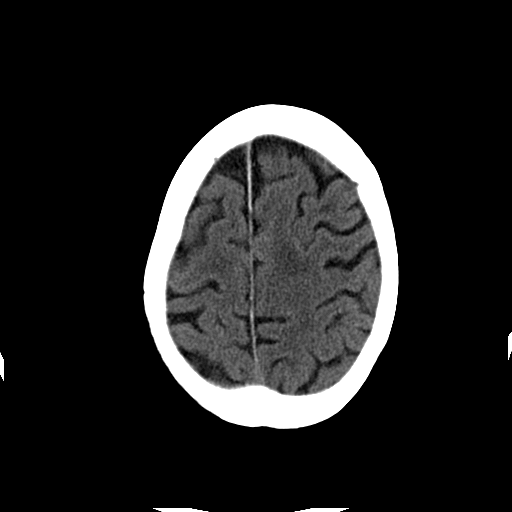
[im 23/30  bone]
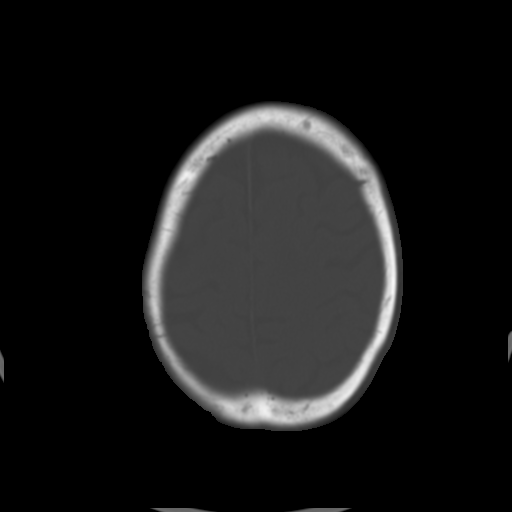
[im 25/30  brain]
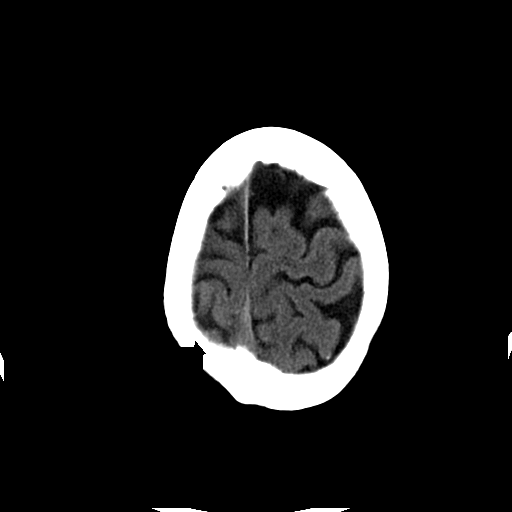
[im 27/30  brain]
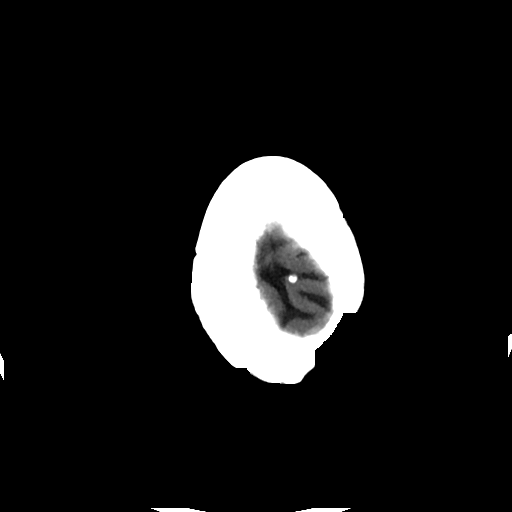

[Series 203: coronal st, idose (1) · coronal · 0.40mm/px · 3 of 73 slices shown]
[im 25/73  brain]
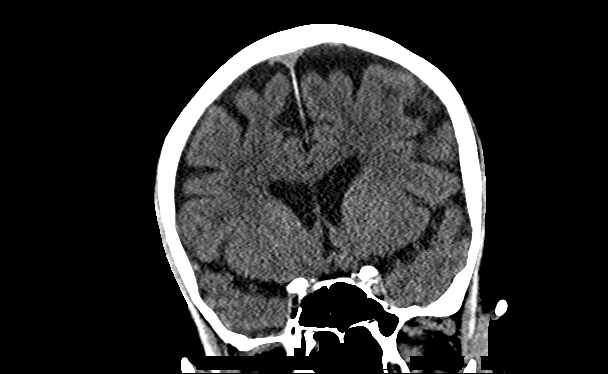
[im 33/73  brain]
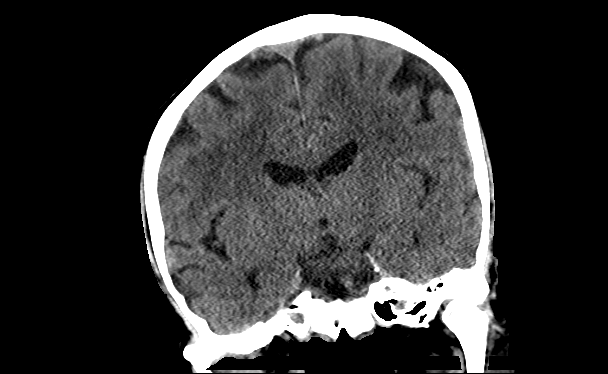
[im 41/73  brain]
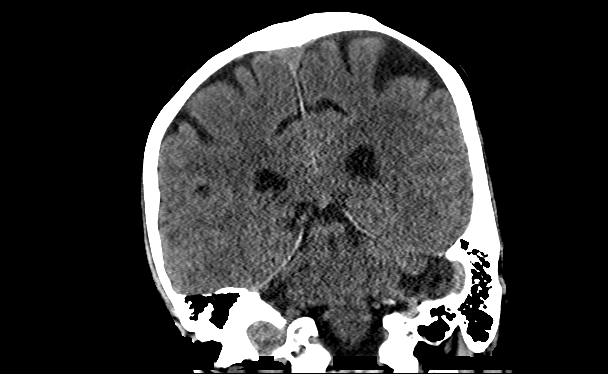

[Series 204: sagittal st, idose (1) · sagittal · 0.40mm/px · 3 of 80 slices shown]
[im 27/80  brain]
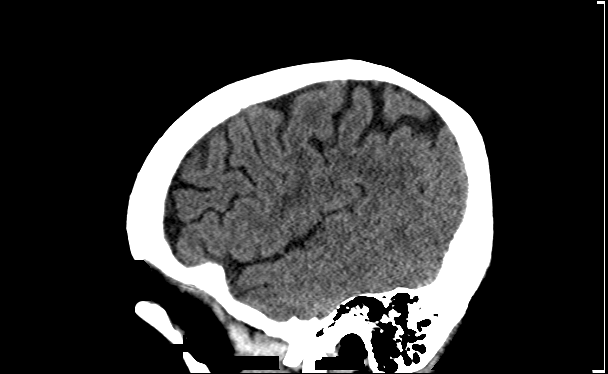
[im 40/80  brain]
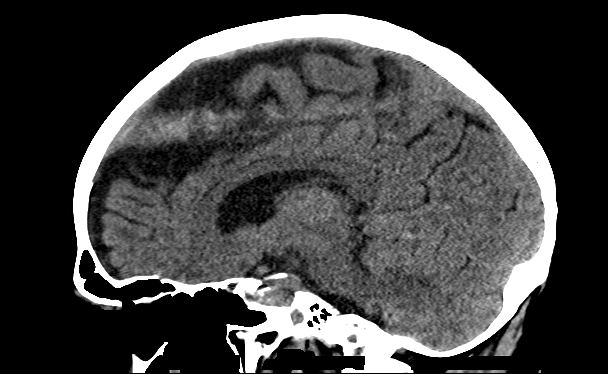
[im 53/80  brain]
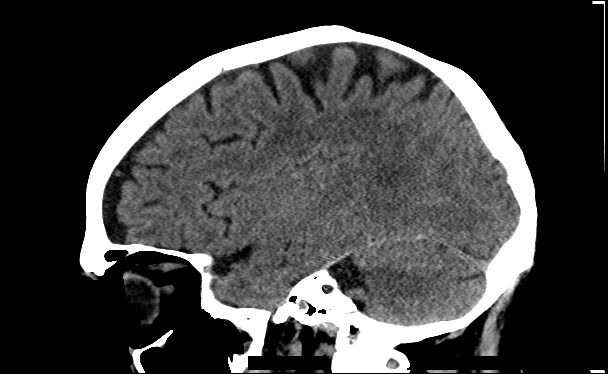

[17 of 47 positions shown; findings below may reference images not displayed]

FINDINGS: There are patchy areas of low attenuation throughout the subcortical
and periventricular white matter compatible with chronic
microvascular disease. Prominence of the sulci and ventricles
identified compatible with brain atrophy. There are a few scattered
parenchymal calcifications identified which may be related to old
infection. The paranasal sinuses and mastoid air cells are clear.
The calvarium is intact.
IMPRESSION: 1. No acute intracranial abnormalities.
2. Chronic microvascular disease and brain atrophy.
3. Scattered parenchymal calcifications identified which likely
reflect sequelae of old infection.
